# Patient Record
Sex: Female | Born: 1945 | Race: Black or African American | Hispanic: No | Smoking: Former smoker
Health system: Southern US, Community
[De-identification: ages and names within clinical notes are randomized; demographics above are authoritative.]

## PROBLEM LIST (undated history)

## (undated) DIAGNOSIS — M51369 Other intervertebral disc degeneration, lumbar region without mention of lumbar back pain or lower extremity pain: Secondary | ICD-10-CM

## (undated) DIAGNOSIS — R42 Dizziness and giddiness: Secondary | ICD-10-CM

## (undated) DIAGNOSIS — J329 Chronic sinusitis, unspecified: Secondary | ICD-10-CM

## (undated) DIAGNOSIS — K589 Irritable bowel syndrome without diarrhea: Secondary | ICD-10-CM

## (undated) DIAGNOSIS — M2141 Flat foot [pes planus] (acquired), right foot: Secondary | ICD-10-CM

## (undated) DIAGNOSIS — M47812 Spondylosis without myelopathy or radiculopathy, cervical region: Secondary | ICD-10-CM

## (undated) DIAGNOSIS — M5431 Sciatica, right side: Secondary | ICD-10-CM

## (undated) DIAGNOSIS — Z8719 Personal history of other diseases of the digestive system: Secondary | ICD-10-CM

## (undated) DIAGNOSIS — I10 Essential (primary) hypertension: Secondary | ICD-10-CM

## (undated) DIAGNOSIS — Z9889 Other specified postprocedural states: Secondary | ICD-10-CM

## (undated) DIAGNOSIS — M8580 Other specified disorders of bone density and structure, unspecified site: Secondary | ICD-10-CM

## (undated) DIAGNOSIS — M1990 Unspecified osteoarthritis, unspecified site: Secondary | ICD-10-CM

## (undated) DIAGNOSIS — R55 Syncope and collapse: Secondary | ICD-10-CM

## (undated) DIAGNOSIS — K5790 Diverticulosis of intestine, part unspecified, without perforation or abscess without bleeding: Secondary | ICD-10-CM

## (undated) DIAGNOSIS — M2142 Flat foot [pes planus] (acquired), left foot: Secondary | ICD-10-CM

## (undated) DIAGNOSIS — R112 Nausea with vomiting, unspecified: Secondary | ICD-10-CM

## (undated) DIAGNOSIS — K5900 Constipation, unspecified: Secondary | ICD-10-CM

## (undated) DIAGNOSIS — R931 Abnormal findings on diagnostic imaging of heart and coronary circulation: Secondary | ICD-10-CM

## (undated) DIAGNOSIS — M5134 Other intervertebral disc degeneration, thoracic region: Secondary | ICD-10-CM

## (undated) DIAGNOSIS — R51 Headache: Secondary | ICD-10-CM

## (undated) DIAGNOSIS — F419 Anxiety disorder, unspecified: Secondary | ICD-10-CM

## (undated) DIAGNOSIS — M5030 Other cervical disc degeneration, unspecified cervical region: Secondary | ICD-10-CM

## (undated) DIAGNOSIS — K635 Polyp of colon: Secondary | ICD-10-CM

## (undated) DIAGNOSIS — J309 Allergic rhinitis, unspecified: Secondary | ICD-10-CM

## (undated) DIAGNOSIS — K219 Gastro-esophageal reflux disease without esophagitis: Secondary | ICD-10-CM

## (undated) DIAGNOSIS — M5136 Other intervertebral disc degeneration, lumbar region: Secondary | ICD-10-CM

## (undated) DIAGNOSIS — G8929 Other chronic pain: Secondary | ICD-10-CM

## (undated) HISTORY — DX: Irritable bowel syndrome, unspecified: K58.9

## (undated) HISTORY — DX: Other intervertebral disc degeneration, thoracic region: M51.34

## (undated) HISTORY — DX: Polyp of colon: K63.5

## (undated) HISTORY — DX: Constipation, unspecified: K59.00

## (undated) HISTORY — PX: EYE SURGERY: SHX253

## (undated) HISTORY — DX: Other chronic pain: G89.29

## (undated) HISTORY — PX: INGUINAL HERNIA REPAIR: SUR1180

## (undated) HISTORY — PX: COLON SURGERY: SHX602

## (undated) HISTORY — DX: Dizziness and giddiness: R42

## (undated) HISTORY — DX: Abnormal findings on diagnostic imaging of heart and coronary circulation: R93.1

## (undated) HISTORY — DX: Other specified disorders of bone density and structure, unspecified site: M85.80

## (undated) HISTORY — PX: JOINT REPLACEMENT: SHX530

## (undated) HISTORY — PX: HERNIA REPAIR: SHX51

## (undated) HISTORY — DX: Sciatica, right side: M54.31

## (undated) HISTORY — DX: Flat foot (pes planus) (acquired), right foot: M21.41

## (undated) HISTORY — DX: Other intervertebral disc degeneration, lumbar region without mention of lumbar back pain or lower extremity pain: M51.369

## (undated) HISTORY — PX: LIPOMA EXCISION: SHX5283

## (undated) HISTORY — DX: Syncope and collapse: R55

## (undated) HISTORY — DX: Allergic rhinitis, unspecified: J30.9

## (undated) HISTORY — DX: Spondylosis without myelopathy or radiculopathy, cervical region: M47.812

## (undated) HISTORY — PX: ABDOMINAL HYSTERECTOMY: SHX81

## (undated) HISTORY — DX: Chronic sinusitis, unspecified: J32.9

## (undated) HISTORY — DX: Other cervical disc degeneration, unspecified cervical region: M50.30

## (undated) HISTORY — DX: Other intervertebral disc degeneration, lumbar region: M51.36

## (undated) HISTORY — DX: Flat foot (pes planus) (acquired), left foot: M21.42

## (undated) HISTORY — DX: Diverticulosis of intestine, part unspecified, without perforation or abscess without bleeding: K57.90

---

## 1998-07-14 ENCOUNTER — Other Ambulatory Visit: Admission: RE | Admit: 1998-07-14 | Discharge: 1998-07-14 | Payer: Self-pay | Admitting: Endocrinology

## 2000-03-28 ENCOUNTER — Encounter: Payer: Self-pay | Admitting: Internal Medicine

## 2000-03-28 ENCOUNTER — Ambulatory Visit (HOSPITAL_COMMUNITY): Admission: RE | Admit: 2000-03-28 | Discharge: 2000-03-28 | Payer: Self-pay | Admitting: Internal Medicine

## 2000-04-04 ENCOUNTER — Encounter: Admission: RE | Admit: 2000-04-04 | Discharge: 2000-07-03 | Payer: Self-pay | Admitting: Family Medicine

## 2000-09-14 ENCOUNTER — Encounter (INDEPENDENT_AMBULATORY_CARE_PROVIDER_SITE_OTHER): Payer: Self-pay | Admitting: *Deleted

## 2000-09-14 ENCOUNTER — Ambulatory Visit (HOSPITAL_COMMUNITY): Admission: RE | Admit: 2000-09-14 | Discharge: 2000-09-14 | Payer: Self-pay | Admitting: Gastroenterology

## 2000-09-19 ENCOUNTER — Ambulatory Visit (HOSPITAL_COMMUNITY): Admission: RE | Admit: 2000-09-19 | Discharge: 2000-09-19 | Payer: Self-pay | Admitting: Gastroenterology

## 2000-09-19 ENCOUNTER — Encounter: Payer: Self-pay | Admitting: Gastroenterology

## 2000-09-26 ENCOUNTER — Encounter (HOSPITAL_BASED_OUTPATIENT_CLINIC_OR_DEPARTMENT_OTHER): Payer: Self-pay | Admitting: General Surgery

## 2000-09-28 ENCOUNTER — Inpatient Hospital Stay (HOSPITAL_COMMUNITY): Admission: RE | Admit: 2000-09-28 | Discharge: 2000-10-03 | Payer: Self-pay | Admitting: General Surgery

## 2000-09-28 ENCOUNTER — Encounter (INDEPENDENT_AMBULATORY_CARE_PROVIDER_SITE_OTHER): Payer: Self-pay | Admitting: Specialist

## 2000-11-16 ENCOUNTER — Encounter: Payer: Self-pay | Admitting: Family Medicine

## 2000-11-16 ENCOUNTER — Encounter: Admission: RE | Admit: 2000-11-16 | Discharge: 2000-11-16 | Payer: Self-pay | Admitting: Family Medicine

## 2001-06-02 ENCOUNTER — Observation Stay (HOSPITAL_COMMUNITY): Admission: RE | Admit: 2001-06-02 | Discharge: 2001-06-03 | Payer: Self-pay | Admitting: General Surgery

## 2001-06-02 ENCOUNTER — Encounter (INDEPENDENT_AMBULATORY_CARE_PROVIDER_SITE_OTHER): Payer: Self-pay | Admitting: Specialist

## 2003-02-04 ENCOUNTER — Ambulatory Visit (HOSPITAL_COMMUNITY): Admission: RE | Admit: 2003-02-04 | Discharge: 2003-02-04 | Payer: Self-pay | Admitting: Gastroenterology

## 2004-03-20 ENCOUNTER — Ambulatory Visit (HOSPITAL_COMMUNITY): Admission: RE | Admit: 2004-03-20 | Discharge: 2004-03-20 | Payer: Self-pay | Admitting: Family Medicine

## 2004-03-20 ENCOUNTER — Encounter (INDEPENDENT_AMBULATORY_CARE_PROVIDER_SITE_OTHER): Payer: Self-pay | Admitting: Family Medicine

## 2004-03-20 ENCOUNTER — Encounter (INDEPENDENT_AMBULATORY_CARE_PROVIDER_SITE_OTHER): Payer: Self-pay | Admitting: Specialist

## 2004-07-08 ENCOUNTER — Encounter: Admission: RE | Admit: 2004-07-08 | Discharge: 2004-07-08 | Payer: Self-pay | Admitting: Neurology

## 2004-10-16 ENCOUNTER — Ambulatory Visit: Payer: Self-pay | Admitting: Hematology & Oncology

## 2004-12-11 ENCOUNTER — Ambulatory Visit: Payer: Self-pay | Admitting: Hematology & Oncology

## 2005-03-03 ENCOUNTER — Ambulatory Visit: Payer: Self-pay | Admitting: Hematology & Oncology

## 2005-05-13 ENCOUNTER — Encounter (INDEPENDENT_AMBULATORY_CARE_PROVIDER_SITE_OTHER): Payer: Self-pay | Admitting: *Deleted

## 2005-05-13 ENCOUNTER — Inpatient Hospital Stay (HOSPITAL_COMMUNITY): Admission: RE | Admit: 2005-05-13 | Discharge: 2005-05-17 | Payer: Self-pay | Admitting: General Surgery

## 2006-05-30 ENCOUNTER — Ambulatory Visit (HOSPITAL_COMMUNITY): Admission: RE | Admit: 2006-05-30 | Discharge: 2006-05-30 | Payer: Self-pay | Admitting: Orthopedic Surgery

## 2006-06-09 ENCOUNTER — Encounter: Admission: RE | Admit: 2006-06-09 | Discharge: 2006-06-09 | Payer: Self-pay | Admitting: Gastroenterology

## 2006-06-22 ENCOUNTER — Ambulatory Visit (HOSPITAL_COMMUNITY): Admission: RE | Admit: 2006-06-22 | Discharge: 2006-06-22 | Payer: Self-pay | Admitting: Gastroenterology

## 2006-06-22 ENCOUNTER — Encounter (INDEPENDENT_AMBULATORY_CARE_PROVIDER_SITE_OTHER): Payer: Self-pay | Admitting: Specialist

## 2007-02-16 ENCOUNTER — Encounter: Admission: RE | Admit: 2007-02-16 | Discharge: 2007-02-16 | Payer: Self-pay | Admitting: Orthopedic Surgery

## 2007-02-18 ENCOUNTER — Encounter: Admission: RE | Admit: 2007-02-18 | Discharge: 2007-02-18 | Payer: Self-pay | Admitting: General Surgery

## 2007-11-29 ENCOUNTER — Encounter: Admission: RE | Admit: 2007-11-29 | Discharge: 2007-11-29 | Payer: Self-pay | Admitting: Family Medicine

## 2007-12-04 ENCOUNTER — Encounter: Admission: RE | Admit: 2007-12-04 | Discharge: 2007-12-04 | Payer: Self-pay | Admitting: Orthopedic Surgery

## 2008-01-23 ENCOUNTER — Ambulatory Visit (HOSPITAL_COMMUNITY): Admission: RE | Admit: 2008-01-23 | Discharge: 2008-01-24 | Payer: Self-pay | Admitting: Ophthalmology

## 2008-08-08 ENCOUNTER — Inpatient Hospital Stay (HOSPITAL_COMMUNITY): Admission: RE | Admit: 2008-08-08 | Discharge: 2008-08-12 | Payer: Self-pay | Admitting: Orthopedic Surgery

## 2008-08-12 ENCOUNTER — Ambulatory Visit: Payer: Self-pay | Admitting: Vascular Surgery

## 2008-08-12 ENCOUNTER — Encounter (INDEPENDENT_AMBULATORY_CARE_PROVIDER_SITE_OTHER): Payer: Self-pay | Admitting: Orthopedic Surgery

## 2008-08-22 ENCOUNTER — Encounter: Admission: RE | Admit: 2008-08-22 | Discharge: 2008-08-22 | Payer: Self-pay | Admitting: Orthopedic Surgery

## 2010-07-23 ENCOUNTER — Encounter: Admission: RE | Admit: 2010-07-23 | Discharge: 2010-07-23 | Payer: Self-pay | Admitting: Gastroenterology

## 2010-12-06 ENCOUNTER — Encounter: Payer: Self-pay | Admitting: Hematology & Oncology

## 2011-03-30 NOTE — Op Note (Signed)
NAMENICOLLE, [name]                ACCOUNT NO.:  [id number]   MEDICAL RECORD NO.:  [id number]          PATIENT TYPE:  INP   LOCATION:  0006                         FACILITY:  [facility]   PHYSICIAN:  John L. Rendall, M.D.  DATE OF BIRTH:  [birth year]   DATE OF PROCEDURE:  08/08/2008  DATE OF DISCHARGE:                               OPERATIVE REPORT   PREOPERATIVE DIAGNOSIS:  Osteoarthritis right knee.   SURGICAL PROCEDURE:  Right LCS total knee replacement with computer  navigation assistance.   POSTOPERATIVE DIAGNOSIS:  Right LCS total knee replacement with computer  navigation assistance.   SURGEON:  John L. Rendall, M.D.   ASSISTANT:  Legrand Pitts. Duffy, PA-C.   ANESTHESIA:  General with a femoral nerve block.   PATHOLOGY:  Bone-on-bone medially was 6 degrees of varus and a flexion  contracture preop.  She has tried all conservative measures and is  living with constant pain.   DESCRIPTION OF PROCEDURE:  Under general anesthetic, the right leg was  prepared with DuraPrep and draped as a sterile field.  A high thigh  tourniquet was placed, the leg was wrapped out with the Esmarch and the  sterile tourniquet was used at 350 mm.  A midline incision was made.  The patella was everted.  The femur was sized to a standard.  Debridement was done in preparation for computer mapping.  Two Schanz  pins were placed in pinholes at the superomedial tibia and two Schanz  pins were placed in the distal medial femur.  The arrays were set up.  The femoral head and medial and lateral malleoli were identified, then  the proximal tibia and distal femur were mapped.  With this done, the  first guide was used for proximal tibial resection.  Once this was  completed, the knee was balanced and the varus deformity was corrected.  The flexion gap was then measured.  Anterior and posterior flare of the  distal femur were then resected with the computer.  The distal femoral  cut was made.  Gaps were balanced  at approximately 12.5 mm.  Recessing  guide was then used after debridement of meniscal remnants, cruciate  remnants and bone spurs off the back of the condyles.  Once this was  completed, the recessing guide was used and the proximal tibia was  exposed.  Minor spurs around the edges were debrided.  The tibia was  sized to a #3.  Center peg hole with keel was inserted.  A size 15  tibial tray was inserted with the standard femur and patellar component.  The knee was in just 2-3 degrees varus and it hyperextends about 3  degrees.  However, on attempting to go to the 17.5 bearing, the knee did  not flex fully and the joint felt overstuffed.  A 15 bearing was the  best fit.  The bony surfaces were then prepared with pulse irrigation.  All components were cemented in place.  The knee appeared to have  excellent longitudinal alignment and flexion to about 110 degrees  easily.  It was closed in layers with #1 [name], #  1 Vicryl, 2-0 Vicryl  and skin clips.  Operative time an hour and 30 minutes.  The patient  tolerated the procedure well and returned to recovery in good condition.      John L. Rendall, M.D.  Electronically Signed     JLR/MEDQ  D:  08/08/2008  T:  08/10/2008  Job:  [id number]

## 2011-03-30 NOTE — Op Note (Signed)
NAMEKIELE, HEAVRIN                ACCOUNT NO.:  [id number]   MEDICAL RECORD NO.:  [id number]          PATIENT TYPE:  AMB   LOCATION:  SDS                          FACILITY:  [facility]   PHYSICIAN:  John D. Ashley Royalty, M.D. 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   PROCEDURE:  Scleral buckle right eye, retinal photocoagulation right  eye.   SURGEON:  Alan Mulder,  M.D.   ASSISTANT:  Rosalie Doctor, MA   ANESTHESIA:  General.   DETAILS:  Usual prep and drape, 360 degrees limbal peritomy, isolation  of four rectus muscles on 2-0 silk.  Localization of break at 10:30  o'clock.  Scleral dissection for 360 degrees to admit a number 279  intrascleral implant.  Diathermy placed in the bed.  A 279 implant was  placed around the globe with 1 mm trimmed from the posterior edge.  A  240 band was placed around the globe with a 270 sleeve at 2 o'clock.  The joint was at 2 o'clock.  The buckle was adjusted and trimmed.  The  band was adjusted and trimmed.  508G radial segment was placed beneath  the break at 10 o'clock.  Perforation site chosen at 9 o'clock.  A large  amount of clear colorless subretinal fluid came forth in a slow  controlled manner.  Once the fluid stopped, the buckle elements were  secured and the scleral flaps were closed with interrupted 4-0  Mersilene.  Indirect ophthalmoscopy showed the break to be well-  supported and the retina to be in place.  The indirect ophthalmoscope  laser was moved into place, 630 burns were placed on the buckle around  the break and the power was 700 milliwatts, 1000 microns each and 0.1  seconds each.  The scleral sutures were knotted and the free ends  removed.  The conjunctiva was reposited with 7-0 chromic suture.  Polymyxin and gentamicin were irrigated into Tenon's space.   Atropine  solution was applied.  Marcaine was injected around the globe for postop  pain.  Decadron 10 mg was injected to the lower subconjunctival space.  TobraDex ophthalmic ointment, patch and shield were placed.  The patient  was awakened and taken to recovery in satisfactory condition.  Closing  pressure was 10 with a Barraquer tonometer.  Complications none.  Duration 1-1/2 hours.      Beulah Gandy. Ashley Royalty, M.D.  Electronically Signed     JDM/MEDQ  D:  01/23/2008  T:  01/24/2008  Job:  [id number]

## 2011-04-02 NOTE — Op Note (Signed)
[name], [name] NO.:  [id number]   MEDICAL RECORD NO.:  [id number]          PATIENT TYPE:  INP   LOCATION:  0001                         FACILITY:  [facility]   PHYSICIAN:  Leonie Man, M.D.   DATE OF BIRTH:  10/23/1946   DATE OF PROCEDURE:  05/13/2005  DATE OF DISCHARGE:                                 OPERATIVE REPORT   PREOPERATIVE DIAGNOSIS:  Recurrent ventral hernia   POSTOPERATIVE DIAGNOSIS:  Recurrent ventral hernia.   PROCEDURE:  Repair recurrent ventral hernia with mesh.   SURGEON:  Leonie Man, M.D.   ASSISTANT:  Anselm Pancoast. Zachery Dakins, M.D.   ANESTHESIA:  General.   NOTE:  The patient is a 65 year old female who is status post right  hemicolectomy following which she developed a ventral hernia which was  repaired with mesh in 2002. She has since developed two new hernias, both  inferior and superior to the previous repair. She comes to the operating  room now for repair of her abdominal wall after the risks and potential  benefits of surgery have been fully discussed, all questions answered, and  consent obtained.   DESCRIPTION OF PROCEDURE:  Following induction of satisfactory general  anesthesia with the patient positioned supinely, the abdomen was prepped and  draped to be included in the sterile operative field. A Foley catheter was  placed in the urinary bladder. The exploration was carried out through a  long midline incision through the old scar cicatrix down to the first hernia  sac which was located in the midline below the umbilicus extending to the  left and then up to the second hernia which is located in the epigastrium  extending to the right. Both hernia sacs were entered. The intervening mesh  between these hernias was then removed. Adhesions to the abdominal wall was  then taken down in their entirety, providing fresh edges the closure of the  abdominal wall. Skin flaps were raised both superiorly, inferiorly, and  laterally on both sides so as to allow for the mesh to be placed. I placed  six initial sutures at the 6 o'clock, 12 o'clock, 3 o'clock, 9 o'clock, 2  o'clock, 8 o'clock, 10 o'clock, and 4 o'clock positions. I then closed the  abdominal wall with a running suture of #1 Novofil. I then used a large  piece of ULTRAPRO mesh which extended across the entire abdominal wall  containing the hernia and keeping the hernia in place and over the initial  suture line in an onlay technique. The mesh was then secured with the  traction sutures as placed and then secured to the fascia with the  endoscopic Protacker. Following this, a running suture of #0 PDS was carried  around the edge of the mesh for the entire distance to secure it to the  underlying fascia. The repair appeared to be intact. Sponge and instrument  counts were all verified. The subcutaneous tissues were irrigated with  normal saline. I placed two 19-French Blake drains in the abdominal wall for  drainage. I then closed the subcutaneous tissues with running  2-0 Vicryl suture.  The skin was closed with staples and the drains attached  to the skin with 3-0 nylon sutures. Sterile dressings were applied. The  anesthetic was reversed, and the patient removed from the operating room to  the recovery room in stable condition. She tolerated the procedure well.       PB/MEDQ  D:  05/13/2005  T:  05/13/2005  Job:  [id number]   cc:   Renaye Rakers, M.D.  [phone] [address], [address]  [place]  [place] [address]  Fax: [phone]

## 2011-04-02 NOTE — Op Note (Signed)
NAME:  [name], [name]                          ACCOUNT NO.:  [id number]   MEDICAL RECORD NO.:  [id number]                   PATIENT TYPE:  AMB   LOCATION:  ENDO                                 FACILITY:  [facility]   PHYSICIAN:  Anselmo Rod, M.D.               DATE OF BIRTH:  [birth year]   DATE OF PROCEDURE:  02/04/2003  DATE OF DISCHARGE:                                 OPERATIVE REPORT   PROCEDURE:  Colonoscopy.   ENDOSCOPIST:  Anselmo Rod, M.D.   INSTRUMENT USED:  Pediatric adjustable Olympus colonoscope.   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   PRE-PROCEDURE PREPARATION:  An informed consent was procured from the  patient.  The patient was fasted for eight hours prior to the procedure, and  prepped with a bottle of MiraLax and __________ on the night prior to the  procedure.   PRE-PROCEDURE PHYSICAL EXAM:  VITAL SIGNS:  Stable.  NECK:  Supple.  CHEST:  Clear to auscultation.  HEART:  S1, S2 regular.  ABDOMEN:  Soft with a well-healed surgical scar.   DESCRIPTION OF PROCEDURE:  The patient was placed in the left lateral  decubitus position and sedated with 50 mg of Demerol and 5 mg of Versed  intravenously.  Once the patient was adequately sedated and maintained on  low-flow oxygen and continuous cardiac monitoring, the Olympus video  colonoscope was advanced in the rectum to the ileum without difficulty.  The  patient had a right hemicolectomy.  The anastomosis appeared healthy.  No  masses or polyps were seen.  A few scattered diverticula were seen in the  left colon.  Retroflexion revealed no acute abnormalities, except for small  non-bleeding internal hemorrhoids.  No masses or polyps were present.  The patient tolerated the  procedure well without complications.   IMPRESSION:  1. Status post right hemicolectomy.  2. Normal-appearing colon except for a few left-sided diverticula.  3. Small non-bleeding internal hemorrhoids, seen on retroflexion.   RECOMMENDATIONS:  1. Repeat colorectal cancer screening is recommended in at least five years,     unless the patient develops any     abnormal symptoms in the interim.  2. A high fiber diet with liberal fluid intake have been recommended.  3. Outpatient followup in case the patient develops any abnormal symptoms in     the interim.                                               Anselmo Rod, M.D.    JNM/MEDQ  D:  02/04/2003  T:  02/04/2003  Job:  [id number]   cc:   Tanya D. Daphine Deutscher, M.D.  [phone] [address]  [place]  [place] [address]  Fax: [phone]   Leonie Man, M.D.

## 2011-04-02 NOTE — Discharge Summary (Signed)
[place]  Patient:    [name], [name]                       MRN: [id number] Adm. Date:  [id number] Disc. Date: [id number] Attending:  Sonda Primes CC:         Anselmo Rod, M.D.  Geraldo Pitter, M.D.   Discharge Summary  ADMISSION DIAGNOSIS:  Villous adenoma of the ascending colon, rule out carcinoma.  POSTOPERATIVE DIAGNOSIS:  Villous adenoma of the ascending colon, rule out carcinoma (pathology is pending).  PROCEDURES: 1. Exploratory laparotomy. 2. Right-sided hemicolectomy with ileocolic anastomosis.  COMPLICATIONS:  None.  CONDITION ON DISCHARGE:  Improved.  HISTORY OF PRESENT ILLNESS:  The patient is a 65 year old woman who presented initially with a history of diverticulosis.  She was being evaluated by colonoscopy for heme-positive stools.  At the time of colonoscopic evaluation a villous adenoma of the ascending colon was seen in addition to the diverticulosis.  Biopsy showed a villous adenoma; however, because of the size of the tumor it could not be removed colonoscopically.  HOSPITAL COURSE:  She is now admitted and brought to the operating room where she underwent a right-sided hemicolectomy.  As noted, the pathology report has not yet returned.  Her postoperative report has been benign with the normal resumption of diet and with bowel activity.  Her wounds at time of discharge are healing satisfactorily.  DISCHARGE FOLLOWUP:  She is being discharged now to be followed in the office in two weeks.  DISCHARGE MEDICATIONS: 1. Vicodin one to two q.4h. p.r.n. pain. 2. She is to resume her usual antihypertensive medications.  DISCHARGE INSTRUCTIONS:  Activities:  As tolerated.  Diet:  Not restricted. DD:  10/03/00 TD:  10/03/00 Job: [id number]/[id number]

## 2011-04-02 NOTE — Procedures (Signed)
[place]  Patient:    CHAITRA, MAST                       MRN: [id number] Proc. Date: 09/14/00 Adm. Date:  [id number] Attending:  Charna Elizabeth CC:         Cephas Darby. Daphine Deutscher, M.D.   Procedure Report  DATE OF BIRTH:  November 22, 1945  PROCEDURE PERFORMED:  Colonoscopy with biopsies.  ENDOSCOPIST:  Anselmo Rod, M.D.  INSTRUMENT USED:  Olympus video colonoscope.  INDICATION FOR PROCEDURE:  Rectal bleeding with change in bowel habits and left lower quadrant pain in a 64 year old black female.  Rule out colonic polyps, masses, hemorrhoids, etc.  PREPROCEDURE PREPARATION: Informed consent was procured from the patient.  The patient was fasted for eight hours prior to the procedure and prepped with a bottle of magnesium citrate and a gallon of NuLytely the night prior to the procedure.  PREPROCEDURE PHYSICAL:  VITAL SIGNS:  The patient has stable vital signs.  NECK:  Supple.  CHEST:  Clear to auscultation.  S1 and S2 regular.  ABDOMEN:  Soft with normal abdominal bowel sounds.  DESCRIPTION OF PROCEDURE:  The patient was placed in the left lateral decubitus position and sedated with Demerol and Versed. Once the patient was adequately sedated and maintained on low flow oxygen, continuous cardiac monitoring, the Olympus video colonoscope was advanced from the rectum to the cecum with slight difficulty secondary to residual stool in the colon; however, visualization was adequate.  A lobulated sessile mass was seen in the right colon just proximal to the hepatic flexure which was biopsied for pathology.  This was somewhat "soft" to biopsy, may be a tubulovillous adenoma.  The rest of the colon up to the cecum appeared normal except for some scattered diverticular disease.  IMPRESSION: 1. Lobulated mass in the right colon just proximal to hepatic flexure.    Biopsied for pathology to rule out adenocarcinoma versus tubulovillous  adenoma. 2. Scattered diverticulosis colon. 3. Some residual stool in the colon.  RECOMMENDATIONS: 1. Await pathology results. 2. No nonsteroidals for now. 3. Check CEA level, CBC with differential, CMET, PT and PTT. 4. Surgical evaluation by Dr. Leonie Man within the next week. DD:  09/15/00 TD:  09/15/00 Job: [id number]

## 2011-04-02 NOTE — Discharge Summary (Signed)
NAMEZAELA, GRALEY                ACCOUNT NO.:  [id number]   MEDICAL RECORD NO.:  [id number]          PATIENT TYPE:  INP   LOCATION:  1607                         [facility]ACILITY:  [facility]   PHYSICIAN:  John L. Rendall, M.D.  DATE OF BIRTH:  1946/05/16   DATE OF ADMISSION:  08/08/2008  DATE OF DISCHARGE:  08/12/2008                               DISCHARGE SUMMARY   ADMISSION DIAGNOSES:  1. End-stage osteoarthritis bilateral knees, right worse than left.  2. Obesity.  3. Hypertension.  4. Mild anxiety.  5. History of lymphoma.   DISCHARGE DIAGNOSES:  1. End-stage osteoarthritis of bilateral knees now status post right      total knee arthroplasty.  2. Acute blood loss anemia secondary to surgery.  3. Postoperative nausea, now resolved.  4. Right calf pain, now resolved.  5. Obesity.  6. Hypertension.  7. Mild anxiety.  8. Lymphoma.   SURGICAL PROCEDURES:  On August 08, 2008. Ms. Farner underwent a right  total knee arthroplasty with computer navigation by Dr. Jonny Ruiz L. Rendall  assisted by Arnoldo Morale PA-C.  She had a DePuy NBT keel tibial tray  cemented size 3 placed with an LCS complete primary femoral component  standard size right.  An LCS complete metal back patella cemented size  standard with an LCS complete RP insert size standard 15 mm thickness.   COMPLICATIONS:  None.   CONSULTS:  1. Social work consult August 09, 2008 in addition to a physical      therapy consult.  2. Occupational therapy consult August 10, 2008.   HISTORY OF PRESENT ILLNESS:  This 65 year old black female patient  presented to Dr. Priscille Kluver with a 2-3 year history of gradual onset of  progressive right leg pain.  The right knee pain is a constant burn over  the anterior joint with radiation to the ankle.  It increases with  standing and walking, and decreases with lying and elevation.  She does  have occasional night pain, but she has failed conservative treatment.  X-rays show end-stage  arthritic changes of the knee. Because of this,  she is presenting for a right knee replacement.   HOSPITAL COURSE:  Ms. Suares tolerated her surgical procedure well and  was transferred to the orthopedic floor.  On postop day 1, T-max was  100, vitals were stable.  Hemoglobin 11.5, hematocrit 34.3, white count  12.8.  Leg was neurovascularly intact.  She did have some nausea.  Medicines were adjusted.  She was weaned off oxygen and started on  therapy per protocol.   Postop day 2, nausea had improved.  T-max 98.1, hemoglobin 9.5,  hematocrit 28.6, white count 13.9.  Dressing was changed.  Hemovac  discontinued.  She was switched to p.o. pain meds and did have some  hypokalemia at 3.1 which was supplemented orally.  She was continued on  therapy.   She continued to make good progress over the next several days.  Required several other supplements for her potassium.  On August 12, 2008, T-max was 99.7, vitals stable.  Hemoglobin 8.8.  She did have a  mildly positive Homans' sign.  Her potassium was still low at 3.4, so  she was supplemented again.  With her calf pain, a Doppler was obtained  which was negative for DVT.  She did make good enough progress with  therapy that day and with a negative Doppler, she was able to be  discharged home.   DISCHARGE DIET:  She can resume her regular prehospitalization diet.   MEDICATIONS:  She may resume her home meds as follows:  1. Benicar/HCT 40/25 mg 1 tablet p.o. q.a.m.  2. Alprazolam 0.5 mg p.o. q.a.m.  3. Aspirin, she is to hold at this time.  4. Over-the-counter antihistamine, she is to hold at this time.  5. Loratadine 10 mg p.o. q.a.m.  6. She is to hold her Tylenol Arthritis at this time.  7. Simethicone 125 mg p.o. p.r.n.   ADDITIONAL MEDICATIONS AT THIS TIME:  1. Arixtra 2.5 mg subcutaneous q.8 p.m. with the last dose on August 17, 2008.  2. On August 18, 2008, she is to resume her daily aspirin.  She is      given 5  __________ no refills.  3. Norco 5/325 1-2 tablets p.o. q.4 h. p.r.n. for pain, 60 with no      refill.  4. Robaxin 500 mg 1-2 tablets p.o. q.6 h. p.r.n. for spasms, 60 with      no refill.  5. She is also encouraged to take an iron supplement twice a day for 1      month.   ACTIVITY:  She can be out of bed weightbearing as tolerated on the right  leg with use of a walker.  No lifting or driving for 6 weeks.  Home  health PT per [facility] and home CPM 0-90 degrees 6-8 hours a day.  Please  see the blue total knee discharge sheet for further activity  instructions.   WOUND CARE:  She may shower after no drainage from the wound for 2 days.  Please see the blue total knee discharge sheet for further wound care  instructions.   FOLLOW UP:  She is to follow up with Dr. Priscille Kluver in our office on  Tuesday, August 20, 2008 and needs to call [phone] for that  appointment.   LABORATORY DATA:  Hemoglobin/hematocrit ranged from 12.4 and 37.7 on  August 05, 2008 to 8.8 and 26.8 on August 12, 2008.  White count  went from 7 on August 05 2008 to 13.9 on August 10, 2008 and 9.3  on August 12, 2008.  Platelets were within normal limits.  Potassium  went from 3.3 on August 05, 2008 to 3.7 on August 09, 2008 to 3.1  on August 10, 2008 to 3.4 on August 12, 2008.  CO2 went to a high  of 33 on  August 12, 2008.  Glucose ranged from 96 on August 05, 2008 to 141  on August 09, 2008 to 95 on August 12, 2008.  UA on August 05, 2008 showed small leukocyte esterase, rare epithelials, 0-2 red and  white cells and a few bacteria.  All other laboratory studies were  within normal limits.      Legrand Pitts Duffy, P.A.      John L. Rendall, M.D.  Electronically Signed    KED/MEDQ  D:  09/05/2008  T:  09/05/2008  Job:  [id number]

## 2011-04-02 NOTE — Discharge Summary (Signed)
[name], [name] NO.:  [id number]   MEDICAL RECORD NO.:  [id number]          PATIENT TYPE:  INP   LOCATION:  1426                         FACILITY:  [facility]   PHYSICIAN:  Leonie Man, M.D.   DATE OF BIRTH:  1946-04-17   DATE OF ADMISSION:  05/13/2005  DATE OF DISCHARGE:                                 DISCHARGE SUMMARY   ADMISSION DIAGNOSES:  1.  Recurrent ventral hernia.  2.  Hypertension.  3.  Status post right hemicolectomy for carcinoma.  4.  History of lymphoma.   DISCHARGE DIAGNOSES:  1.  Recurrent ventral hernia.  2.  Hypertension.  3.  Status post right hemicolectomy for carcinoma.  4.  History of lymphoma.   PROCEDURE:  Repair of recurrent ventral hernia.   COMPLICATIONS:  None.   CONDITION ON DISCHARGE:  Improved.   FOLLOW-UP:  One week at [facility] for removal of sutures.   TAKE-HOME PRESCRIPTIONS:  Percocet and Allegra.   DIET:  No restrictions.   ACTIVITY:  As tolerated.   The patient is a 65 year old female who is status post right hemicolectomy  in the past. She developed a ventral hernia following surgery and had that  repaired last in 2002. She has since developed two additional hernias at the  superior and inferior aspects of her incision. She was brought to the  operating room for repair of these hernias. This was performed on the day of  admission. Her postoperative course has been benign with normal resumption  of diet  and activity. At the time of discharge her wounds were healing well. She has  no calf tenderness or ankle edema. Her lungs are clear. She is being  discharged now to be followed up in the office in 1 week for suture removal.  Activity:  She is restricted to lifting no more than 15 pounds for the next  6 weeks. Diet is not restricted.       PB/MEDQ  D:  05/17/2005  T:  05/17/2005  Job:  [id number]   cc:   Renaye Rakers, M.D.  [phone] [address], [address]  [place]  [place] [address]  Fax: [phone]

## 2011-04-02 NOTE — Op Note (Signed)
[place]. [facility]  Patient:    [name], [name]                       MRN: [id number] Proc. Date: 06/02/01 Adm. Date:  [id number] Attending:  Sonda Primes                           Operative Report  PREOPERATIVE DIAGNOSIS:  Ventral hernia.  POSTOPERATIVE DIAGNOSIS:  Ventral hernia.  OPERATION PERFORMED:  Repair of ventral hernia with Prolene mesh.  SURGEON:  Mardene Celeste. Lurene Shadow, M.D.  ASSISTANT:  Nurse.  ANESTHESIA:  General.  INDICATIONS FOR PROCEDURE:  The patient is a 65 year old woman who is status post a right hemicolectomy some years ago for a large villous adenoma.  She returns now with abdominal pain and bulge.  On examination, she was noted to have a large ventral hernia just superior to the umbilicus and extending over towards the right abdomen.  She was brought to the operating room for repair after the risks and potential benefits of hernia repair under general anesthesia had been fully discussed with the patient and all her questions answered.  DESCRIPTION OF PROCEDURE:  Following the induction of satisfactory general anesthesia with the patient positioned supinely, the abdomen was routinely prepped and draped to be included in a sterile operative field.  I made a midline incision extending almost up to the xiphoid and then carried down to just above the umbilicus, deepened this through the skin and subcutaneous tissues carrying the dissection down to the hernia sac.  The sac was dissected free on all sides and opened and the contents contained within the sac were dissected free and allowed to reduce into the peritoneal cavity.  I then used a #1 Novofil to reimbricate the large epigastric portion of the abdomen which I did not find any hernias but was somewhat lax.  Then I implanted a Prolene mesh into the hernial defect after the sac had been dissected free and removed.  This was sewn in with a running #1 Novofil suture.   Sponge, instrument and sharp counts were verified.  All areas of dissection were then checked for hemostasis and noted to be dry.  Subcutaneous tissues were closed with a running 2-0 Vicryl suture and the skin closed with a running 4-0 Monocryl suture and then reinforced with Steri-Strips and a sterile dressing applied.  Anesthetic reversed.  Patient removed from the operating room to the recovery room in stable condition having tolerated the procedure well. DD:  06/02/01 TD:  06/02/01 Job: [id number]

## 2011-04-02 NOTE — Op Note (Signed)
[name], [name]                ACCOUNT NO.:  [id number]   MEDICAL RECORD NO.:  [id number]          PATIENT TYPE:  AMB   LOCATION:  ENDO                         FACILITY:  [facility]   PHYSICIAN:  Anselmo Rod, M.D.  DATE OF BIRTH:  February 15, 1946   DATE OF PROCEDURE:  06/22/2006  DATE OF DISCHARGE:                                 OPERATIVE REPORT   PROCEDURE PERFORMED:  Colonoscopy with cold biopsy x1.   ENDOSCOPIST:  Anselmo Rod, M.D.   INSTRUMENT USED:  Olympus video colonoscope.   INDICATIONS FOR PROCEDURE:  A 65 year old female undergoing screening  colonoscopy.  Patient has a right hemicolectomy for a tubulovillous adenoma,  recurrent polyps, masses, etc.   PREPROCEDURE PREPARATION:  Informed consent was procured from the patient.  The patient fasted for four hours prior to the procedure and prepped with 32  Osmoprep pills the night of and the morning of the procedure.  Risks and  benefits of the procedure including a 10% miss rate of cancer and polyps was  discussed with the patient as well.   PREPROCEDURE PHYSICAL:  VITAL SIGNS:  Stable vital signs.  NECK:  Supple.  CHEST:  Clear to auscultation.  CARDIOVASCULAR:  S1 and S2 regular.  ABDOMEN:  Soft with normal bowel sounds.   DESCRIPTION OF PROCEDURE:  The patient was placed in the left lateral  decubitus position, sedated with 75 mcg of Fentanyl and 5 mg of Versed in  slow incremental doses.  Once the patient was adequately sedated and  maintained on low flow oxygen and continuous cardiac monitoring, the Olympus  video colonoscope was advanced from the rectum to the anastomosis with  slight difficulty.  There was some residual stool in the colon.  Multiple  washings were done.  A small sessile polyp was biopsied from the  rectosigmoid colon (cold biopsies x1).  There was extensive sigmoid  diverticulosis noted.  A healthy anastomosis was noted at 90 cm, small  internal hemorrhoids were seen on retroflexion.  The  patient tolerated the  procedure well without complications.   IMPRESSION:  1.Small nonbleeding internal hemorrhoids.  2.Extensive sigmoid diverticulosis.  3.Small sessile polyp biopsied from rectosigmoid colon (cold biopsy x1).  4.Significant amount of residual stool in the colon, multiple washings,  small lesions could be missed.   RECOMMENDATIONS:  1.Continue a high fiber diet with liberal fluid intake.  2.Repeat colonoscopy in the next five years unless the patient develops any  abdominal symptoms in the interim, in which case she should contact the  office immediately.  3.Outpatient follow-up as need arises in the future.      Anselmo Rod, M.D.  Electronically Signed     JNM/MEDQ  D:  06/22/2006  T:  06/22/2006  Job:  [id number]   cc:   Renaye Rakers, M.D.  Leonie Man, M.D.

## 2011-04-02 NOTE — Op Note (Signed)
[place]. [facility]  Patient:    [name], [name]                       MRN: [id number] Proc. Date: 09/28/00 Adm. Date:  [id number] Attending:  Sonda Primes CC:         Anselmo Rod, M.D.  Tanya D. Daphine Deutscher, M.D., [facility]   Operative Report  PREOPERATIVE DIAGNOSIS:  Villous adenoma of ascending colon, rule out carcinoma.  POSTOPERATIVE DIAGNOSIS:   Villous adenoma of ascending colon, rule out carcinoma.  PROCEDURE:  Exploratory laparotomy, right hemicolectomy, with ileocolic anastomosis, takedown splenic flexure.  SURGEON:  Mardene Celeste. Lurene Shadow, M.D.  ASSISTANT:  Marnee Spring. Wiliam Ke, M.D.  ANESTHESIA:  General.  CLINICAL NOTE:  The patient is a 65 year old woman who on colonoscopy is noted to have a villous adenoma of the colon located in the ascending colon.  She is brought to the operating room for right hemicolectomy for removal of this broad-based lesion, which may have some carcinoma within it.  DESCRIPTION OF PROCEDURE:  Following the induction of anesthesia, the patient was positioned supinely and the abdomen is prepped and draped to be included in the sterile operative field.  The exploratory laparotomy is created through a midline incision, deepened through the skin and subcutaneous tissue and through the linea alba.  Upon entering the abdomen, there were a few adhesions that were noted to be taken down.  The right colon was mobilized at the cecum, and an exploration was carried out so as to locate the mass within the ascending colon.  This could not be felt by palpation. I then created a colotomy at the cecum and inserted a sterile proctoscope into the cecum in search of the lesion.  The lesion was not initially identified, so the entire right colon was mobilized all the way down to the splenic flexure, where better visualization of the colon could be carried out.  This was done, and through a second colotomy and with the  proctoscope inserted into the colon, the lesion was eventually noted.  It was indeed in the right colon just proximal to the hepatic flexure.  The colotomies were closed with both pursestring sutures and interrupted Lembert sutures of 3-0 silk.  The right colon was fully mobilized around the hepatic flexure as previously, and the colon was transected at just proximate to the middle colic vessels, and the distal ileum was transected.  This was done with GIA staples.  The entire mesentery of the right colon was then mobilized fully, protective of the duodenum.  The mesenteric _____ was taken between clamps and secured with ties of 2-0 silk.  The mesenteric defect was then closed with interrupted 3-0 silk sutures and a functional end-to-end anastomosis carried out with a GIA stapler and a TA60 stapling device.  The resulting anastomosis was noted to be widely patent.  Sponge, instrument, and sharp counts were then verified.  The wound was thoroughly irrigated with multiple aliquots of normal saline and then closed in layers as follows:  The midline closed with a running suture of #1 Novofil, subcutaneous tissues were irrigated, skin closed with staples. Sterile dressings applied, anesthetic reversed, patient moved from the operating room to the recovery room in stable condition, having tolerated the procedure well. DD:  09/28/00 TD:  09/28/00 Job: [id number]

## 2011-08-09 LAB — BASIC METABOLIC PANEL
BUN: 14
CO2: 28
Calcium: 9.6
Creatinine, Ser: 0.83
GFR calc Af Amer: >60
Glucose, Bld: 103 — ABNORMAL HIGH
Potassium: 3.2 — ABNORMAL LOW

## 2011-08-09 LAB — CBC: RBC: 4.46

## 2011-08-16 LAB — COMPREHENSIVE METABOLIC PANEL
Albumin: 3.6
Alkaline Phosphatase: 97
Creatinine, Ser: 0.84
GFR calc non Af Amer: >60
Total Bilirubin: 0.8

## 2011-08-16 LAB — PROTIME-INR
INR: 1.1
Prothrombin Time: 14.1

## 2011-08-16 LAB — BASIC METABOLIC PANEL
CO2: 30
CO2: 33 — ABNORMAL HIGH
Calcium: 8.5
Calcium: 8.7
Calcium: 9
Chloride: 103
Creatinine, Ser: 0.63
Creatinine, Ser: 0.69
GFR calc Af Amer: >60
GFR calc Af Amer: >60
GFR calc Af Amer: >60
GFR calc non Af Amer: >60
Glucose, Bld: 95
Potassium: 3.1 — ABNORMAL LOW
Sodium: 138
Sodium: 139

## 2011-08-16 LAB — CBC
HCT: 28.6 — ABNORMAL LOW
HCT: 37.7
Hemoglobin: 11.5 — ABNORMAL LOW
Hemoglobin: 9.5 — ABNORMAL LOW
MCHC: 32.8
MCHC: 32.9
MCHC: 33
MCHC: 33.3
MCHC: 33.6
MCV: 85.8
MCV: 86.1
Platelets: 325
RBC: 3.12 — ABNORMAL LOW
RBC: 3.27 — ABNORMAL LOW
RBC: 3.32 — ABNORMAL LOW
RBC: 4.01
RDW: 13
RDW: 13
RDW: 13.2
WBC: 12.8 — ABNORMAL HIGH

## 2011-08-16 LAB — URINE MICROSCOPIC-ADD ON

## 2011-08-16 LAB — DIFFERENTIAL
Basophils Relative: 0
Lymphocytes Relative: 39
Lymphs Abs: 2.7
Monocytes Relative: 7
Neutrophils Relative %: 50

## 2011-08-16 LAB — ABO/RH: ABO/RH(D): A POS

## 2011-08-16 LAB — URINE CULTURE
Colony Count: NO GROWTH
Culture: NO GROWTH

## 2011-08-16 LAB — CROSSMATCH
ABO/RH(D): A POS
Antibody Screen: NEGATIVE

## 2011-08-16 LAB — URINALYSIS, ROUTINE W REFLEX MICROSCOPIC
Bilirubin Urine: NEGATIVE
Hgb urine dipstick: NEGATIVE
Nitrite: NEGATIVE
Specific Gravity, Urine: 1.022
Urobilinogen, UA: 0.2
pH: 5.5

## 2014-02-14 ENCOUNTER — Other Ambulatory Visit: Payer: Self-pay | Admitting: Orthopedic Surgery

## 2014-03-06 ENCOUNTER — Encounter (HOSPITAL_COMMUNITY): Payer: Self-pay | Admitting: Pharmacy Technician

## 2014-03-12 ENCOUNTER — Encounter (HOSPITAL_COMMUNITY): Payer: Self-pay

## 2014-03-12 ENCOUNTER — Encounter (INDEPENDENT_AMBULATORY_CARE_PROVIDER_SITE_OTHER): Payer: Self-pay

## 2014-03-12 ENCOUNTER — Ambulatory Visit (HOSPITAL_COMMUNITY)
Admission: RE | Admit: 2014-03-12 | Discharge: 2014-03-12 | Disposition: A | Discharge status: Home / Self Care | Payer: Medicare Other | Source: Ambulatory Visit | Attending: Anesthesiology | Admitting: Anesthesiology

## 2014-03-12 ENCOUNTER — Encounter (HOSPITAL_COMMUNITY)
Admission: RE | Admit: 2014-03-12 | Discharge: 2014-03-12 | Disposition: A | Discharge status: Home / Self Care | Payer: Medicare Other | Source: Ambulatory Visit | Attending: Orthopedic Surgery | Admitting: Orthopedic Surgery

## 2014-03-12 DIAGNOSIS — Z01818 Encounter for other preprocedural examination: Secondary | ICD-10-CM | POA: Insufficient documentation

## 2014-03-12 DIAGNOSIS — I10 Essential (primary) hypertension: Secondary | ICD-10-CM | POA: Insufficient documentation

## 2014-03-12 DIAGNOSIS — Z01812 Encounter for preprocedural laboratory examination: Secondary | ICD-10-CM | POA: Insufficient documentation

## 2014-03-12 HISTORY — DX: Anxiety disorder, unspecified: F41.9

## 2014-03-12 HISTORY — DX: Essential (primary) hypertension: I10

## 2014-03-12 HISTORY — DX: Gastro-esophageal reflux disease without esophagitis: K21.9

## 2014-03-12 HISTORY — DX: Personal history of other diseases of the digestive system: Z87.19

## 2014-03-12 HISTORY — DX: Nausea with vomiting, unspecified: R11.2

## 2014-03-12 HISTORY — DX: Unspecified osteoarthritis, unspecified site: M19.90

## 2014-03-12 HISTORY — DX: Headache: R51

## 2014-03-12 HISTORY — DX: Other specified postprocedural states: Z98.890

## 2014-03-12 LAB — URINE MICROSCOPIC-ADD ON

## 2014-03-12 LAB — URINALYSIS, ROUTINE W REFLEX MICROSCOPIC
Bilirubin Urine: NEGATIVE
Glucose, UA: NEGATIVE mg/dL
Hgb urine dipstick: NEGATIVE
KETONES UR: NEGATIVE mg/dL
NITRITE: NEGATIVE
Protein, ur: NEGATIVE mg/dL
Specific Gravity, Urine: 1.019 (ref 1.005–1.030)
UROBILINOGEN UA: 1 mg/dL (ref 0.0–1.0)
pH: 6 (ref 5.0–8.0)

## 2014-03-12 LAB — CBC
HEMATOCRIT: 38.9 % (ref 36.0–46.0)
Hemoglobin: 12.6 g/dL (ref 12.0–15.0)
MCH: 27.5 pg (ref 26.0–34.0)
MCHC: 32.4 g/dL (ref 30.0–36.0)
MCV: 84.9 fL (ref 78.0–100.0)
PLATELETS: 263 10*3/uL (ref 150–400)
RBC: 4.58 MIL/uL (ref 3.87–5.11)
RDW: 13.4 % (ref 11.5–15.5)
WBC: 12.7 10*3/uL — AB (ref 4.0–10.5)

## 2014-03-12 LAB — SURGICAL PCR SCREEN
MRSA, PCR: NEGATIVE
Staphylococcus aureus: NEGATIVE

## 2014-03-12 LAB — COMPREHENSIVE METABOLIC PANEL
ALBUMIN: 4 g/dL (ref 3.5–5.2)
ALK PHOS: 114 U/L (ref 39–117)
ALT: 13 U/L (ref 0–35)
AST: 17 U/L (ref 0–37)
BUN: 14 mg/dL (ref 6–23)
CHLORIDE: 101 meq/L (ref 96–112)
CO2: 28 mEq/L (ref 19–32)
Calcium: 10.2 mg/dL (ref 8.4–10.5)
Creatinine, Ser: 0.79 mg/dL (ref 0.50–1.10)
GFR calc Af Amer: >90 mL/min (ref >90)
GFR calc non Af Amer: 82 mL/min — ABNORMAL LOW (ref >90)
Glucose, Bld: 103 mg/dL — ABNORMAL HIGH (ref 70–99)
Potassium: 4.3 mEq/L (ref 3.7–5.3)
Sodium: 141 mEq/L (ref 137–147)
TOTAL PROTEIN: 8.2 g/dL (ref 6.0–8.3)
Total Bilirubin: 0.5 mg/dL (ref 0.3–1.2)

## 2014-03-12 LAB — APTT: APTT: 26 s (ref 24–37)

## 2014-03-12 LAB — PROTIME-INR
INR: 1.06 (ref 0.00–1.49)
PROTHROMBIN TIME: 13.6 s (ref 11.6–15.2)

## 2014-03-12 NOTE — Progress Notes (Signed)
01/07/2014-EKG from Dr. Chestine Sporelark on chart.

## 2014-03-12 NOTE — Patient Instructions (Signed)
20     Your procedure is scheduled on:  Monday 03/18/2014  Report to [facility] at  0730 AM.  Call this number if you have problems the night before or morning of surgery: [phone]   Remember:             IF YOU USE CPAP,BRING MASK AND TUBING AM OF SURGERY!             IF YOU DO NOT HAVE YOUR TYPE AND SCREEN DRAWN AT PRE-ADMIT APPOINTMENT, YOU WILL HAVE IT DRAWN AM OF SURGERY!   Do not eat food or drink liquids AFTER MIDNIGHT!  Take these medicines the morning of surgery with A SIP OF WATER: Flonase nasal spray, Prilosec    [facility] IS NOT RESPONSIBLE FOR ANY BELONGINGS OR VALUABLES BROUGHT TO HOSPITAL.  Marland Kitchen  Leave suitcase in the car. After surgery it may be brought to your room.  For patients admitted to the hospital, checkout time is 11:00 AM the day of              Discharge.    DO NOT WEAR JEWELRY,MAKE-UP,LOTIONS,POWDERS,PERFUMES,CONTACTS , DENTURES OR BRIDGEWORK ,AND DO NOT WEAR FALSE EYELASHES                                    Patients discharged the day of surgery will not be allowed to drive home. If going home the same day of surgery, must have someone stay with you  first 24 hrs.at home and arrange for someone to drive you home from the Hospital.                         YOUR DRIVER IS:   Special Instructions:              Please read over the following fact sheets that you were given:             1. [place] PREPARING FOR SURGERY SHEET              2.[facility]                                        [place].[name]     [phone]                              [facility] - Preparing for Surgery Before surgery, you can play an important role.  Because skin is not sterile, your skin needs to be as free of germs as possible.  You can reduce the number of germs on your skin by washing with CHG (chlorahexidine gluconate) soap before surgery.  CHG is an antiseptic cleaner which kills germs and bonds with the skin to continue killing  germs even after washing. Please DO NOT use if you have an allergy to CHG or antibacterial soaps.  If your skin becomes reddened/irritated stop using the CHG and inform your nurse when you arrive at Short Stay. Do not shave (including legs and underarms) for at least 48 hours prior to the first CHG shower.  You may shave your face. Please follow these instructions carefully:  1.  Shower with CHG Soap the night before surgery and the  morning of  Surgery.  2.  If you choose to wash your hair, wash your hair first as usual with your  normal  shampoo.  3.  After you shampoo, rinse your hair and body thoroughly to remove the  shampoo.                           4.  Use CHG as you would any other liquid soap.  You can apply chg directly  to the skin and wash                       Gently with a scrungie or clean washcloth.  5.  Apply the CHG Soap to your body ONLY FROM THE NECK DOWN.   Do not use on open                           Wound or open sores. Avoid contact with eyes, ears mouth and genitals (private parts).                        Genitals (private parts) with your normal soap.             6.  Wash thoroughly, paying special attention to the area where your surgery  will be performed.  7.  Thoroughly rinse your body with warm water from the neck down.  8.  DO NOT shower/wash with your normal soap after using and rinsing off  the CHG Soap.                9.  Pat yourself dry with a clean towel.            10.  Wear clean pajamas.            11.  Place clean sheets on your bed the night of your first shower and do not  sleep with pets. Day of Surgery : Do not apply any lotions/deodorants the morning of surgery.  Please wear clean clothes to the hospital/surgery center.  FAILURE TO FOLLOW THESE INSTRUCTIONS MAY RESULT IN THE CANCELLATION OF YOUR SURGERY PATIENT SIGNATURE_________________________________  NURSE  SIGNATURE__________________________________  ________________________________________________________________________   [name]  An incentive spirometer is a tool that can help keep your lungs clear and active. This tool measures how well you are filling your lungs with each breath. Taking long deep breaths may help reverse or decrease the chance of developing breathing (pulmonary) problems (especially infection) following:  A long period of time when you are unable to move or be active. BEFORE THE PROCEDURE   If the spirometer includes an indicator to show your best effort, your nurse or respiratory therapist will set it to a desired goal.  If possible, sit up straight or lean slightly forward. Try not to slouch.  Hold the incentive spirometer in an upright position. INSTRUCTIONS FOR USE  1. Sit on the edge of your bed if possible, or sit up as far as you can in bed or on a chair. 2. Hold the incentive spirometer in an upright position. 3. Breathe out normally. 4. Place the mouthpiece in your mouth and seal your lips tightly around it. 5. Breathe in slowly and as deeply as possible, raising the piston or the ball toward the top of the column. 6. Hold your breath for 3-5 seconds or for as long as possible. Allow the piston or ball  to fall to the bottom of the column. 7. Remove the mouthpiece from your mouth and breathe out normally. 8. Rest for a few seconds and repeat Steps 1 through 7 at least 10 times every 1-2 hours when you are awake. Take your time and take a few normal breaths between deep breaths. 9. The spirometer may include an indicator to show your best effort. Use the indicator as a goal to work toward during each repetition. 10. After each set of 10 deep breaths, practice coughing to be sure your lungs are clear. If you have an incision (the cut made at the time of surgery), support your incision when coughing by placing a pillow or rolled up towels firmly  against it. Once you are able to get out of bed, walk around indoors and cough well. You may stop using the incentive spirometer when instructed by your caregiver.  RISKS AND COMPLICATIONS  Take your time so you do not get dizzy or light-headed.  If you are in pain, you may need to take or ask for pain medication before doing incentive spirometry. It is harder to take a deep breath if you are having pain. AFTER USE  Rest and breathe slowly and easily.  It can be helpful to keep track of a log of your progress. Your caregiver can provide you with a simple table to help with this. If you are using the spirometer at home, follow these instructions: SEEK MEDICAL CARE IF:   You are having difficultly using the spirometer.  You have trouble using the spirometer as often as instructed.  Your pain medication is not giving enough relief while using the spirometer.  You develop fever of 100.5 F (38.1 C) or higher. SEEK IMMEDIATE MEDICAL CARE IF:   You cough up bloody sputum that had not been present before.  You develop fever of 102 F (38.9 C) or greater.  You develop worsening pain at or near the incision site. MAKE SURE YOU:   Understand these instructions.  Will watch your condition.  Will get help right away if you are not doing well or get worse. Document Released: 03/14/2007 Document Revised: 01/24/2012 Document Reviewed: 05/15/2007 ExitCare Patient Information 2014 [employer], [place].   ________________________________________________________________________  WHAT IS A BLOOD TRANSFUSION? Blood Transfusion Information  A transfusion is the replacement of blood or some of its parts. Blood is made up of multiple cells which provide different functions.  Red blood cells carry oxygen and are used for blood loss replacement.  White blood cells fight against infection.  Platelets control bleeding.  Plasma helps clot blood.  Other blood products are available for  specialized needs, such as hemophilia or other clotting disorders. BEFORE THE TRANSFUSION  Who gives blood for transfusions?   Healthy volunteers who are fully evaluated to make sure their blood is safe. This is blood bank blood. Transfusion therapy is the safest it has ever been in the practice of medicine. Before blood is taken from a donor, a complete history is taken to make sure that person has no history of diseases nor engages in risky social behavior (examples are intravenous drug use or sexual activity with multiple partners). The donor's travel history is screened to minimize risk of transmitting infections, such as malaria. The donated blood is tested for signs of infectious diseases, such as HIV and hepatitis. The blood is then tested to be sure it is compatible with you in order to minimize the chance of a transfusion reaction. If you or a  relative donates blood, this is often done in anticipation of surgery and is not appropriate for emergency situations. It takes many days to process the donated blood. RISKS AND COMPLICATIONS Although transfusion therapy is very safe and saves many lives, the main dangers of transfusion include:   Getting an infectious disease.  Developing a transfusion reaction. This is an allergic reaction to something in the blood you were given. Every precaution is taken to prevent this. The decision to have a blood transfusion has been considered carefully by your caregiver before blood is given. Blood is not given unless the benefits outweigh the risks. AFTER THE TRANSFUSION  Right after receiving a blood transfusion, you will usually feel much better and more energetic. This is especially true if your red blood cells have gotten low (anemic). The transfusion raises the level of the red blood cells which carry oxygen, and this usually causes an energy increase.  The nurse administering the transfusion will monitor you carefully for complications. HOME CARE  INSTRUCTIONS  No special instructions are needed after a transfusion. You may find your energy is better. Speak with your caregiver about any limitations on activity for underlying diseases you may have. SEEK MEDICAL CARE IF:   Your condition is not improving after your transfusion.  You develop redness or irritation at the intravenous (IV) site. SEEK IMMEDIATE MEDICAL CARE IF:  Any of the following symptoms occur over the next 12 hours:  Shaking chills.  You have a temperature by mouth above 102 F (38.9 C), not controlled by medicine.  Chest, back, or muscle pain.  People around you feel you are not acting correctly or are confused.  Shortness of breath or difficulty breathing.  Dizziness and fainting.  You get a rash or develop hives.  You have a decrease in urine output.  Your urine turns a dark color or changes to pink, red, or brown. Any of the following symptoms occur over the next 10 days:  You have a temperature by mouth above 102 F (38.9 C), not controlled by medicine.  Shortness of breath.  Weakness after normal activity.  The white part of the eye turns yellow (jaundice).  You have a decrease in the amount of urine or are urinating less often.  Your urine turns a dark color or changes to pink, red, or brown. Document Released: 10/29/2000 Document Revised: 01/24/2012 Document Reviewed: 06/17/2008 [facility] Patient Information 2014 [place], [place].  _______________________________________________________________________

## 2014-03-17 ENCOUNTER — Other Ambulatory Visit: Payer: Self-pay | Admitting: Orthopedic Surgery

## 2014-03-17 NOTE — H&P (Signed)
[name] DOB: Sep 29, 1946 Divorced / Language: English / Race: Black or African American Female  Date of Admission:  03-18-2014  Chief Complaint:  Left Knee Pain  History of Present Illness The patient is scheduled for a left total knee arthroplasty to be performed by Dr. Gus RankinFrank V. Aluisio, MD at [facility] on 03-18-2014. The patient is a 68 year old female who presents with knee complaints. The patient is seen for a second opinion. The patient reports left knee symptoms including: pain which began year(s) ago without any known injury.The patient feels that the symptoms are worsening. The patient has the current diagnosis of knee osteoarthritis. Prior to being seen today the patient was previously evaluated by a colleague ([place]. Manson [place]Brown [place] Previous work-up for this problem has included knee x-rays. Past treatment for this problem has included intra-articular injection of corticosteroids and physical therapy. and include knee pain and difficulty ambulating (especially up or down steps). Current treatment includes nonsteroidal anti-inflammatory drugs (ibuprofen or aspirin). Risk factors include total knee replacement (Right TKA done in 2009. She cannot remember the name of her surgeon). [name] came in on referral from her PCP for continued left knee pain. She kas a previous right total knee replacement done back in 2009 and its doing pretty well. The left knee has gotten worse over thepast couple of months especially since October of 2014 when she had s severe flare up taking her to her PCP and then reff[place] to [place] in [place]. Her xrays done at that time showed bone on bone arthritis already. It was recommended she have a knee replacement done but she wanted a second opinion. She had been seen here in our practice for her neck in the past and wanted to come here. Referral from her PCP. She comes in with xrays from October. Her pain is  not severe but has been progressive. Most of her pain is in the proximal tibia and lateral over the outside ligament. The left knee is not quite as bad as the rihgt knee when she had it replaced but it has gotten worse. She describes "crunching" with the knee but no swelling, locking and fortunately not buckling. She does have some night pain and the knee is starting to have more constant pain at this time. She con go up her 15 steps at home fairly well but ging down her steps are more difficult. She does not have a lot of sedinatry stiffness but does have some pain getting up after sitting for a while. She is using OTC meds and has tried heat with some releif. No ice has been used. It not unbearable but progressive. She has a trip planned last fall but had to cancel it becuase of the knee flare up. She is now ready to proceed with the knee replacement on the left. They have been treated conservatively in the past for the above stated problem and despite conservative measures, they continue to have progressive pain and severe functional limitations and dysfunction. They have failed non-operative management including home exercise, medications, and injections. It is felt that they would benefit from undergoing total joint replacement. Risks and benefits of the procedure have been discussed with the patient and they elect to proceed with surgery. There are no active contraindications to surgery such as ongoing infection or rapidly progressive neurological disease.   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 Lymphocytic Gastroesophageal Reflux Disease Chronic Pain Migraine Headache High blood pressure Cataract Hiatal Hernia Hemorrhoids Chronic Gastritis Neck Pain   Family History Cerebrovascular Accident. sister Diabetes  Mellitus. sister Cancer. Sister. sister    Social History Illicit drug use. no Pain Contract. no Children. 2 Current work status. 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 divorced Tobacco / smoke exposure. no Tobacco use. Former smoker. former smoker; smoke(d) less than 1/2 pack(s) per day Alcohol use. current drinker; drinks wine and hard liquor; only occasionally per week Drug/Alcohol Rehab (Previously). no Exercise. Exercises rarely; does running / walking Drug/Alcohol Rehab (Currently). no Number of flights of stairs before winded. 1 Advance Directives. Healthcare POA Post-Surgical Plans. [facility]    Medication History Tribenzor (20-5-12.5MG  Tablet, Oral) Active. Omeprazole (40MG  Capsule DR, Oral) Active. Fluticasone Propionate (50MCG/ACT Suspension, Nasal) Active. Claritin (10MG  Tablet, Oral) Active. Aspirin Buffered (325MG  Tablet, Oral) Active. Ibuprofen (200MG  Tablet, Oral) Active. Medications Reconciled.   Past Surgical History Total Knee Replacement. Date: 07/2008. right Colectomy. Date: 2003. partial Cataract Surgery. Date: 02/2009. right Hysterectomy. Date: 65. complete (cancerous) Colon Polyp Removal - Colonoscopy Hiatal Hernia Repair. Date: 2004. Incisional Hernia Repair. Date: 2006. Repair of Right Detached Retina. Date: 01/2008.   Review of Systems General:Not Present- Chills, Fever, Night Sweats, Fatigue, Weight Gain, Weight Loss and Memory Loss. Skin:Not Present- Hives, Itching, Rash, Eczema and Lesions. HEENT:Present- Headache. Not Present- Tinnitus, Double Vision, Visual Loss, Hearing Loss and Dentures. Respiratory:Not Present- Shortness of breath with exertion, Shortness of breath at rest, Allergies, Coughing up blood and Chronic Cough. Cardiovascular:Not Present- Chest Pain, Racing/skipping heartbeats, Difficulty Breathing Lying Down, Murmur, Swelling and  Palpitations. Gastrointestinal:Present- Abdominal Pain and Constipation. Not Present- Bloody Stool, Heartburn, Vomiting, Nausea, Diarrhea, Difficulty Swallowing, Jaundice and Loss of appetitie. Female Genitourinary:Not Present- Blood in Urine, Urinary frequency, Weak urinary stream, Discharge, Flank Pain, Incontinence, Painful Urination, Urgency, Urinary Retention and Urinating at Night. Musculoskeletal:Present- Back Pain, Morning Stiffness and Spasms. Not Present- Muscle Weakness, Muscle Pain, Joint Swelling and Joint Pain. Neurological:Not Present- Tremor, Dizziness, Blackout spells, Paralysis, Difficulty with balance and Weakness. Psychiatric:Not Present- Insomnia.    Vitals Weight: 163 lb Height: 59 in Weight was reported by patient. Height was reported by patient. Body Surface Area: 1.75 m Body Mass Index: 32.92 kg/m Pulse: 72 (Regular) Resp.: 14 (Unlabored) BP: 142/78 (Sitting, Left Arm, Standard)     Physical Exam The physical exam findings are as follows:   General Mental Status - Alert, cooperative and good historian. General Appearance- pleasant. Not in acute distress. Orientation- Oriented X3. Build & Nutrition- Well nourished and Well developed.   Head and Neck Head- normocephalic, atraumatic . Neck Global Assessment- supple. no bruit auscultated on the right and no bruit auscultated on the left.   Eye Pupil- Bilateral- Regular and Round. Motion- Bilateral- EOMI.   Chest and Lung Exam Auscultation: Breath sounds:- clear at anterior chest wall and - clear at posterior chest wall. Adventitious sounds:- No Adventitious sounds.   Cardiovascular Auscultation:Rhythm- Regular rate and rhythm. Heart Sounds- S1 WNL and S2 WNL. Murmurs & Other Heart Sounds:Auscultation of the heart reveals - No Murmurs.   Abdomen Palpation/Percussion:Tenderness- Abdomen is non-tender to palpation. Rigidity (guarding)- Abdomen is  soft. Auscultation:Auscultation of the abdomen reveals - Bowel sounds normal.   Female Genitourinary Not done, not pertinent to present illness  Musculoskeletal Her right knee looks fine. Her range of motion is 0-105 degrees with no tenderness or instability.  Left knee with  no effusion. Range 5-105 degrees with marked crepitus on range of motion. There is medial joint line tenderness of the left knee. There is no lateral tenderness. No instability.  RADIOGRAPHS AP of both knees and lateral show the prosthesis on the right in good position with no abnormalities. On the left she has significant bone on bone arthritis of the medial and patellofemoral compartments with varus deformity and large osteophytes.   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 Lequita HaltAluisio.  Plan [facility]  PCP - Dr. Delorise JacksonValencia Eggleston-Clark - Patient has been seen preoperatively and felt to be stable for surgery. GI - Dr. Molli PoseyJuothi Mann  The patient will not receive TXA (tranexamic acid) due to: Cancer History  Please note that the patient states that she gets nauseated with anesthesia.   Signed electronically by Lauraine RinneAlexzandrew L Graelyn Bihl, III PA-C

## 2014-03-18 ENCOUNTER — Encounter (HOSPITAL_COMMUNITY): Payer: Medicare Other | Admitting: Anesthesiology

## 2014-03-18 ENCOUNTER — Encounter (HOSPITAL_COMMUNITY): Payer: Self-pay | Admitting: *Deleted

## 2014-03-18 ENCOUNTER — Inpatient Hospital Stay (HOSPITAL_COMMUNITY): Payer: Medicare Other | Admitting: Anesthesiology

## 2014-03-18 ENCOUNTER — Inpatient Hospital Stay (HOSPITAL_COMMUNITY)
Admission: RE | Admit: 2014-03-18 | Discharge: 2014-03-21 | DRG: 470 | Disposition: A | Discharge status: Skilled Nursing Facility | Payer: Medicare Other | Source: Ambulatory Visit | Attending: Orthopedic Surgery | Admitting: Orthopedic Surgery

## 2014-03-18 ENCOUNTER — Encounter (HOSPITAL_COMMUNITY)
Admission: RE | Disposition: A | Discharge status: Skilled Nursing Facility | Payer: Self-pay | Source: Ambulatory Visit | Attending: Orthopedic Surgery

## 2014-03-18 DIAGNOSIS — D62 Acute posthemorrhagic anemia: Secondary | ICD-10-CM | POA: Diagnosis not present

## 2014-03-18 DIAGNOSIS — Z87891 Personal history of nicotine dependence: Secondary | ICD-10-CM

## 2014-03-18 DIAGNOSIS — F411 Generalized anxiety disorder: Secondary | ICD-10-CM | POA: Diagnosis present

## 2014-03-18 DIAGNOSIS — Z833 Family history of diabetes mellitus: Secondary | ICD-10-CM

## 2014-03-18 DIAGNOSIS — M1710 Unilateral primary osteoarthritis, unspecified knee: Secondary | ICD-10-CM

## 2014-03-18 DIAGNOSIS — Z882 Allergy status to sulfonamides status: Secondary | ICD-10-CM

## 2014-03-18 DIAGNOSIS — M179 Osteoarthritis of knee, unspecified: Secondary | ICD-10-CM | POA: Diagnosis present

## 2014-03-18 DIAGNOSIS — Z79899 Other long term (current) drug therapy: Secondary | ICD-10-CM

## 2014-03-18 DIAGNOSIS — Z823 Family history of stroke: Secondary | ICD-10-CM

## 2014-03-18 DIAGNOSIS — Z860100 Personal history of colon polyps, unspecified: Secondary | ICD-10-CM

## 2014-03-18 DIAGNOSIS — E876 Hypokalemia: Secondary | ICD-10-CM | POA: Diagnosis not present

## 2014-03-18 DIAGNOSIS — Z888 Allergy status to other drugs, medicaments and biological substances status: Secondary | ICD-10-CM

## 2014-03-18 DIAGNOSIS — M898X9 Other specified disorders of bone, unspecified site: Secondary | ICD-10-CM | POA: Diagnosis present

## 2014-03-18 DIAGNOSIS — I10 Essential (primary) hypertension: Secondary | ICD-10-CM | POA: Diagnosis present

## 2014-03-18 DIAGNOSIS — Z7982 Long term (current) use of aspirin: Secondary | ICD-10-CM

## 2014-03-18 DIAGNOSIS — K219 Gastro-esophageal reflux disease without esophagitis: Secondary | ICD-10-CM | POA: Diagnosis present

## 2014-03-18 DIAGNOSIS — Z791 Long term (current) use of non-steroidal anti-inflammatories (NSAID): Secondary | ICD-10-CM

## 2014-03-18 HISTORY — PX: TOTAL KNEE ARTHROPLASTY: SHX125

## 2014-03-18 HISTORY — DX: Osteoarthritis of knee, unspecified: M17.9

## 2014-03-18 HISTORY — DX: Unilateral primary osteoarthritis, unspecified knee: M17.10

## 2014-03-18 LAB — TYPE AND SCREEN
ABO/RH(D): A POS
ANTIBODY SCREEN: NEGATIVE

## 2014-03-18 SURGERY — ARTHROPLASTY, KNEE, TOTAL
Anesthesia: General | Site: Knee | Laterality: Left

## 2014-03-18 MED ORDER — BUPIVACAINE LIPOSOME 1.3 % IJ SUSP
20.0000 mL | Freq: Once | INTRAMUSCULAR | Status: DC
  Filled 2014-03-18: qty 20

## 2014-03-18 MED ORDER — KETOROLAC TROMETHAMINE 15 MG/ML IJ SOLN: 7.5000 mg | Freq: Four times a day (QID) | INTRAMUSCULAR | Status: AC | PRN

## 2014-03-18 MED ORDER — METHOCARBAMOL 1000 MG/10ML IJ SOLN
500.0000 mg | Freq: Four times a day (QID) | INTRAMUSCULAR | Status: DC | PRN
  Administered 2014-03-18: 500 mg via INTRAVENOUS
  Filled 2014-03-18: qty 5

## 2014-03-18 MED ORDER — PANTOPRAZOLE SODIUM 40 MG PO TBEC
80.0000 mg | DELAYED_RELEASE_TABLET | Freq: Every day | ORAL | Status: DC
  Administered 2014-03-18: 80 mg via ORAL
  Filled 2014-03-18 (×2): qty 2

## 2014-03-18 MED ORDER — SODIUM CHLORIDE 0.9 % IJ SOLN
INTRAMUSCULAR | Status: DC | PRN
  Administered 2014-03-18: 30 mL

## 2014-03-18 MED ORDER — DEXAMETHASONE SODIUM PHOSPHATE 10 MG/ML IJ SOLN
10.0000 mg | Freq: Once | INTRAMUSCULAR | Status: AC
  Administered 2014-03-18: 10 mg via INTRAVENOUS

## 2014-03-18 MED ORDER — PROPOFOL 10 MG/ML IV BOLUS
INTRAVENOUS | Status: AC
  Filled 2014-03-18: qty 20

## 2014-03-18 MED ORDER — BUPIVACAINE LIPOSOME 1.3 % IJ SUSP
INTRAMUSCULAR | Status: DC | PRN
  Administered 2014-03-18: 20 mL

## 2014-03-18 MED ORDER — PROPOFOL 10 MG/ML IV BOLUS
INTRAVENOUS | Status: DC | PRN
  Administered 2014-03-18: 150 mg via INTRAVENOUS
  Administered 2014-03-18: 50 mg via INTRAVENOUS

## 2014-03-18 MED ORDER — LACTATED RINGERS IV SOLN
INTRAVENOUS | Status: DC
  Administered 2014-03-18: 11:00:00 via INTRAVENOUS
  Administered 2014-03-18: 1000 mL via INTRAVENOUS

## 2014-03-18 MED ORDER — BUPIVACAINE HCL 0.25 % IJ SOLN
INTRAMUSCULAR | Status: DC | PRN
  Administered 2014-03-18: 20 mL

## 2014-03-18 MED ORDER — CHLORHEXIDINE GLUCONATE 4 % EX LIQD: 60.0000 mL | Freq: Once | CUTANEOUS | Status: DC

## 2014-03-18 MED ORDER — DIPHENHYDRAMINE HCL 12.5 MG/5ML PO ELIX: 12.5000 mg | ORAL_SOLUTION | ORAL | Status: DC | PRN

## 2014-03-18 MED ORDER — MORPHINE SULFATE 2 MG/ML IJ SOLN
1.0000 mg | INTRAMUSCULAR | Status: DC | PRN
  Administered 2014-03-18: 2 mg via INTRAVENOUS
  Filled 2014-03-18: qty 1

## 2014-03-18 MED ORDER — RIVAROXABAN 10 MG PO TABS
10.0000 mg | ORAL_TABLET | Freq: Every day | ORAL | Status: DC
  Administered 2014-03-19 – 2014-03-21 (×3): 10 mg via ORAL
  Filled 2014-03-18 (×4): qty 1

## 2014-03-18 MED ORDER — LABETALOL HCL 5 MG/ML IV SOLN
INTRAVENOUS | Status: DC | PRN
  Administered 2014-03-18: 5 mg via INTRAVENOUS

## 2014-03-18 MED ORDER — METOCLOPRAMIDE HCL 5 MG/ML IJ SOLN: 5.0000 mg | Freq: Three times a day (TID) | INTRAMUSCULAR | Status: DC | PRN

## 2014-03-18 MED ORDER — EPHEDRINE SULFATE 50 MG/ML IJ SOLN
INTRAMUSCULAR | Status: AC
  Filled 2014-03-18: qty 1

## 2014-03-18 MED ORDER — BUPIVACAINE HCL (PF) 0.25 % IJ SOLN
INTRAMUSCULAR | Status: AC
  Filled 2014-03-18: qty 30

## 2014-03-18 MED ORDER — CEFAZOLIN SODIUM-DEXTROSE 2-3 GM-% IV SOLR
2.0000 g | Freq: Four times a day (QID) | INTRAVENOUS | Status: AC
  Administered 2014-03-18 (×2): 2 g via INTRAVENOUS
  Filled 2014-03-18 (×2): qty 50

## 2014-03-18 MED ORDER — PROMETHAZINE HCL 25 MG PO TABS: 25.0000 mg | ORAL_TABLET | Freq: Four times a day (QID) | ORAL | Status: DC | PRN

## 2014-03-18 MED ORDER — ALPRAZOLAM 0.5 MG PO TABS
0.5000 mg | ORAL_TABLET | Freq: Two times a day (BID) | ORAL | Status: DC | PRN
  Administered 2014-03-20: 0.5 mg via ORAL
  Filled 2014-03-18 (×2): qty 1

## 2014-03-18 MED ORDER — DEXAMETHASONE SODIUM PHOSPHATE 10 MG/ML IJ SOLN
10.0000 mg | Freq: Every day | INTRAMUSCULAR | Status: AC
  Filled 2014-03-18: qty 1

## 2014-03-18 MED ORDER — GLYCOPYRROLATE 0.2 MG/ML IJ SOLN
INTRAMUSCULAR | Status: DC | PRN
  Administered 2014-03-18: .4 mg via INTRAVENOUS

## 2014-03-18 MED ORDER — SODIUM CHLORIDE 0.9 % IJ SOLN
INTRAMUSCULAR | Status: AC
  Filled 2014-03-18: qty 10

## 2014-03-18 MED ORDER — ONDANSETRON HCL 4 MG/2ML IJ SOLN
INTRAMUSCULAR | Status: AC
  Filled 2014-03-18: qty 2

## 2014-03-18 MED ORDER — DOCUSATE SODIUM 100 MG PO CAPS
100.0000 mg | ORAL_CAPSULE | Freq: Two times a day (BID) | ORAL | Status: DC
  Administered 2014-03-18 – 2014-03-21 (×6): 100 mg via ORAL

## 2014-03-18 MED ORDER — FLUTICASONE PROPIONATE 50 MCG/ACT NA SUSP
1.0000 | Freq: Two times a day (BID) | NASAL | Status: DC
  Administered 2014-03-19 – 2014-03-21 (×3): 1 via NASAL
  Filled 2014-03-18 (×2): qty 16

## 2014-03-18 MED ORDER — SODIUM CHLORIDE 0.9 % IV SOLN: INTRAVENOUS | Status: DC

## 2014-03-18 MED ORDER — SUCCINYLCHOLINE CHLORIDE 20 MG/ML IJ SOLN
INTRAMUSCULAR | Status: DC | PRN
  Administered 2014-03-18: 100 mg via INTRAVENOUS

## 2014-03-18 MED ORDER — ACETAMINOPHEN 10 MG/ML IV SOLN
1000.0000 mg | Freq: Once | INTRAVENOUS | Status: AC
  Administered 2014-03-18: 1000 mg via INTRAVENOUS
  Filled 2014-03-18: qty 100

## 2014-03-18 MED ORDER — EPHEDRINE SULFATE 50 MG/ML IJ SOLN
INTRAMUSCULAR | Status: DC | PRN
  Administered 2014-03-18: 5 mg via INTRAVENOUS

## 2014-03-18 MED ORDER — NEOSTIGMINE METHYLSULFATE 10 MG/10ML IV SOLN
INTRAVENOUS | Status: AC
  Filled 2014-03-18: qty 1

## 2014-03-18 MED ORDER — LORATADINE 10 MG PO TABS
10.0000 mg | ORAL_TABLET | Freq: Every day | ORAL | Status: DC
  Administered 2014-03-18 – 2014-03-21 (×4): 10 mg via ORAL
  Filled 2014-03-18 (×4): qty 1

## 2014-03-18 MED ORDER — OLMESARTAN-AMLODIPINE-HCTZ 20-5-12.5 MG PO TABS: 1.0000 | ORAL_TABLET | Freq: Every morning | ORAL | Status: DC

## 2014-03-18 MED ORDER — ONDANSETRON HCL 4 MG PO TABS: 4.0000 mg | ORAL_TABLET | Freq: Four times a day (QID) | ORAL | Status: DC | PRN

## 2014-03-18 MED ORDER — METOCLOPRAMIDE HCL 10 MG PO TABS: 5.0000 mg | ORAL_TABLET | Freq: Three times a day (TID) | ORAL | Status: DC | PRN

## 2014-03-18 MED ORDER — ROCURONIUM BROMIDE 100 MG/10ML IV SOLN
INTRAVENOUS | Status: AC
  Filled 2014-03-18: qty 1

## 2014-03-18 MED ORDER — HYDROMORPHONE HCL PF 1 MG/ML IJ SOLN
INTRAMUSCULAR | Status: AC
Start: 2014-03-18 — End: 2014-03-19
  Filled 2014-03-18: qty 1

## 2014-03-18 MED ORDER — CEFAZOLIN SODIUM-DEXTROSE 2-3 GM-% IV SOLR
2.0000 g | INTRAVENOUS | Status: AC
  Administered 2014-03-18: 2 g via INTRAVENOUS

## 2014-03-18 MED ORDER — TRAMADOL HCL 50 MG PO TABS
50.0000 mg | ORAL_TABLET | Freq: Four times a day (QID) | ORAL | Status: DC | PRN
Start: 2014-03-18 — End: 2014-03-21
  Administered 2014-03-19 – 2014-03-20 (×2): 100 mg via ORAL
  Filled 2014-03-18 (×2): qty 2

## 2014-03-18 MED ORDER — ONDANSETRON HCL 4 MG/2ML IJ SOLN
INTRAMUSCULAR | Status: DC | PRN
  Administered 2014-03-18: 4 mg via INTRAVENOUS

## 2014-03-18 MED ORDER — HYDROMORPHONE HCL PF 1 MG/ML IJ SOLN
0.2500 mg | INTRAMUSCULAR | Status: DC | PRN
  Administered 2014-03-18 (×4): 0.5 mg via INTRAVENOUS

## 2014-03-18 MED ORDER — NEOSTIGMINE METHYLSULFATE 10 MG/10ML IV SOLN
INTRAVENOUS | Status: DC | PRN
  Administered 2014-03-18: 3 mg via INTRAVENOUS

## 2014-03-18 MED ORDER — CEFAZOLIN SODIUM-DEXTROSE 2-3 GM-% IV SOLR
INTRAVENOUS | Status: AC
  Filled 2014-03-18: qty 50

## 2014-03-18 MED ORDER — AMLODIPINE BESYLATE 5 MG PO TABS
5.0000 mg | ORAL_TABLET | Freq: Every day | ORAL | Status: DC
  Administered 2014-03-18: 5 mg via ORAL
  Filled 2014-03-18 (×2): qty 1

## 2014-03-18 MED ORDER — DEXAMETHASONE SODIUM PHOSPHATE 10 MG/ML IJ SOLN
INTRAMUSCULAR | Status: AC
  Filled 2014-03-18: qty 1

## 2014-03-18 MED ORDER — OXYCODONE HCL 5 MG PO TABS
5.0000 mg | ORAL_TABLET | ORAL | Status: DC | PRN
  Administered 2014-03-18 – 2014-03-21 (×15): 10 mg via ORAL
  Filled 2014-03-18 (×14): qty 2

## 2014-03-18 MED ORDER — MENTHOL 3 MG MT LOZG
1.0000 | LOZENGE | OROMUCOSAL | Status: DC | PRN
  Filled 2014-03-18: qty 9

## 2014-03-18 MED ORDER — FENTANYL CITRATE 0.05 MG/ML IJ SOLN
INTRAMUSCULAR | Status: AC
  Filled 2014-03-18: qty 5

## 2014-03-18 MED ORDER — IRBESARTAN 150 MG PO TABS
150.0000 mg | ORAL_TABLET | Freq: Every day | ORAL | Status: DC
  Administered 2014-03-18: 150 mg via ORAL
  Filled 2014-03-18 (×2): qty 1

## 2014-03-18 MED ORDER — ACETAMINOPHEN 500 MG PO TABS
1000.0000 mg | ORAL_TABLET | Freq: Four times a day (QID) | ORAL | Status: AC
  Administered 2014-03-18 – 2014-03-19 (×4): 1000 mg via ORAL
  Filled 2014-03-18 (×4): qty 2

## 2014-03-18 MED ORDER — LABETALOL HCL 5 MG/ML IV SOLN
INTRAVENOUS | Status: AC
  Filled 2014-03-18: qty 4

## 2014-03-18 MED ORDER — ACETAMINOPHEN 650 MG RE SUPP: 650.0000 mg | Freq: Four times a day (QID) | RECTAL | Status: DC | PRN

## 2014-03-18 MED ORDER — METHOCARBAMOL 500 MG PO TABS
500.0000 mg | ORAL_TABLET | Freq: Four times a day (QID) | ORAL | Status: DC | PRN
  Administered 2014-03-18 – 2014-03-21 (×6): 500 mg via ORAL
  Filled 2014-03-18 (×5): qty 1

## 2014-03-18 MED ORDER — FLEET ENEMA 7-19 GM/118ML RE ENEM: 1.0000 | ENEMA | Freq: Once | RECTAL | Status: AC | PRN

## 2014-03-18 MED ORDER — SODIUM CHLORIDE 0.9 % IR SOLN
Status: DC | PRN
  Administered 2014-03-18: 1000 mL

## 2014-03-18 MED ORDER — ACETAMINOPHEN 325 MG PO TABS: 650.0000 mg | ORAL_TABLET | Freq: Four times a day (QID) | ORAL | Status: DC | PRN

## 2014-03-18 MED ORDER — BISACODYL 10 MG RE SUPP: 10.0000 mg | Freq: Every day | RECTAL | Status: DC | PRN

## 2014-03-18 MED ORDER — LACTATED RINGERS IV SOLN: INTRAVENOUS | Status: DC

## 2014-03-18 MED ORDER — ONDANSETRON HCL 4 MG/2ML IJ SOLN
4.0000 mg | Freq: Four times a day (QID) | INTRAMUSCULAR | Status: DC | PRN
  Administered 2014-03-19: 4 mg via INTRAVENOUS
  Filled 2014-03-18: qty 2

## 2014-03-18 MED ORDER — ROCURONIUM BROMIDE 100 MG/10ML IV SOLN
INTRAVENOUS | Status: DC | PRN
  Administered 2014-03-18: 20 mg via INTRAVENOUS

## 2014-03-18 MED ORDER — SODIUM CHLORIDE 0.9 % IV SOLN
INTRAVENOUS | Status: DC
  Administered 2014-03-18 – 2014-03-19 (×2): via INTRAVENOUS

## 2014-03-18 MED ORDER — SODIUM CHLORIDE 0.9 % IJ SOLN
INTRAMUSCULAR | Status: AC
Start: 2014-03-18 — End: 2014-03-18
  Filled 2014-03-18: qty 50

## 2014-03-18 MED ORDER — AMLODIPINE BESYLATE 5 MG PO TABS
5.0000 mg | ORAL_TABLET | Freq: Once | ORAL | Status: AC
  Administered 2014-03-18: 5 mg via ORAL
  Filled 2014-03-18: qty 1

## 2014-03-18 MED ORDER — 0.9 % SODIUM CHLORIDE (POUR BTL) OPTIME
TOPICAL | Status: DC | PRN
  Administered 2014-03-18: 1000 mL

## 2014-03-18 MED ORDER — LIP MEDEX EX OINT
TOPICAL_OINTMENT | CUTANEOUS | Status: AC
  Filled 2014-03-18: qty 7

## 2014-03-18 MED ORDER — HYDROMORPHONE HCL PF 1 MG/ML IJ SOLN
INTRAMUSCULAR | Status: AC
  Filled 2014-03-18: qty 1

## 2014-03-18 MED ORDER — FENTANYL CITRATE 0.05 MG/ML IJ SOLN
INTRAMUSCULAR | Status: DC | PRN
  Administered 2014-03-18: 100 ug via INTRAVENOUS
  Administered 2014-03-18 (×5): 50 ug via INTRAVENOUS

## 2014-03-18 MED ORDER — DEXAMETHASONE 6 MG PO TABS
10.0000 mg | ORAL_TABLET | Freq: Every day | ORAL | Status: AC
  Administered 2014-03-19: 10 mg via ORAL
  Filled 2014-03-18: qty 1

## 2014-03-18 MED ORDER — GLYCOPYRROLATE 0.2 MG/ML IJ SOLN
INTRAMUSCULAR | Status: AC
  Filled 2014-03-18: qty 3

## 2014-03-18 MED ORDER — POLYETHYLENE GLYCOL 3350 17 G PO PACK: 17.0000 g | PACK | Freq: Every day | ORAL | Status: DC | PRN

## 2014-03-18 MED ORDER — FENTANYL CITRATE 0.05 MG/ML IJ SOLN
INTRAMUSCULAR | Status: AC
  Filled 2014-03-18: qty 2

## 2014-03-18 MED ORDER — HYDROCHLOROTHIAZIDE 12.5 MG PO CAPS
12.5000 mg | ORAL_CAPSULE | Freq: Every day | ORAL | Status: DC
  Administered 2014-03-18: 12.5 mg via ORAL
  Filled 2014-03-18 (×2): qty 1

## 2014-03-18 MED ORDER — PHENOL 1.4 % MT LIQD: 1.0000 | OROMUCOSAL | Status: DC | PRN

## 2014-03-18 SURGICAL SUPPLY — 56 items
BAG ZIPLOCK 12X15 (MISCELLANEOUS) ×3 IMPLANT
BANDAGE ELASTIC 6 VELCRO ST LF (GAUZE/BANDAGES/DRESSINGS) ×3 IMPLANT
BANDAGE ESMARK 6X9 LF (GAUZE/BANDAGES/DRESSINGS) ×1 IMPLANT
BLADE SAG 18X100X1.27 (BLADE) ×3 IMPLANT
BLADE SAW SGTL 11.0X1.19X90.0M (BLADE) ×3 IMPLANT
BNDG ESMARK 6X9 LF (GAUZE/BANDAGES/DRESSINGS) ×3
BOWL SMART MIX CTS (DISPOSABLE) ×3 IMPLANT
CAPT RP KNEE ×3 IMPLANT
CEMENT HV SMART SET (Cement) ×6 IMPLANT
CLOSURE WOUND 1/2 X4 (GAUZE/BANDAGES/DRESSINGS) ×2
CUFF TOURN SGL QUICK 34 (TOURNIQUET CUFF) ×2
CUFF TRNQT CYL 34X4X40X1 (TOURNIQUET CUFF) ×1 IMPLANT
DECANTER SPIKE VIAL GLASS SM (MISCELLANEOUS) ×3 IMPLANT
DRAPE EXTREMITY T 121X128X90 (DRAPE) ×3 IMPLANT
DRAPE POUCH INSTRU U-SHP 10X18 (DRAPES) ×3 IMPLANT
DRAPE U-SHAPE 47X51 STRL (DRAPES) ×3 IMPLANT
DRSG ADAPTIC 3X8 NADH LF (GAUZE/BANDAGES/DRESSINGS) ×3 IMPLANT
DRSG PAD ABDOMINAL 8X10 ST (GAUZE/BANDAGES/DRESSINGS) ×3 IMPLANT
DURAPREP 26ML APPLICATOR (WOUND CARE) ×3 IMPLANT
ELECT REM PT RETURN 9FT ADLT (ELECTROSURGICAL) ×3
ELECTRODE REM PT RTRN 9FT ADLT (ELECTROSURGICAL) ×1 IMPLANT
EVACUATOR 1/8 PVC DRAIN (DRAIN) ×3 IMPLANT
FACESHIELD WRAPAROUND (MASK) ×12 IMPLANT
GLOVE BIO SURGEON STRL SZ7.5 (GLOVE) ×3 IMPLANT
GLOVE BIO SURGEON STRL SZ8 (GLOVE) ×3 IMPLANT
GLOVE BIOGEL PI IND STRL 8 (GLOVE) ×3 IMPLANT
GLOVE BIOGEL PI INDICATOR 8 (GLOVE) ×6
GLOVE ECLIPSE 7.5 STRL STRAW (GLOVE) ×3 IMPLANT
GOWN SPEC L3 XXLG W/TWL (GOWN DISPOSABLE) ×3 IMPLANT
GOWN STRL REUS W/TWL LRG LVL3 (GOWN DISPOSABLE) ×3 IMPLANT
GOWN STRL REUS W/TWL XL LVL3 (GOWN DISPOSABLE) ×6 IMPLANT
HANDPIECE INTERPULSE COAX TIP (DISPOSABLE) ×3
IMMOBILIZER KNEE 20 (SOFTGOODS) ×3 IMPLANT
KIT BASIN OR (CUSTOM PROCEDURE TRAY) ×3 IMPLANT
MANIFOLD NEPTUNE II (INSTRUMENTS) ×3 IMPLANT
NDL SAFETY ECLIPSE 18X1.5 (NEEDLE) ×2 IMPLANT
NEEDLE HYPO 18GX1.5 SHARP (NEEDLE) ×6
NS IRRIG 1000ML POUR BTL (IV SOLUTION) ×3 IMPLANT
PACK TOTAL JOINT (CUSTOM PROCEDURE TRAY) ×3 IMPLANT
PADDING CAST COTTON 6X4 STRL (CAST SUPPLIES) ×9 IMPLANT
POSITIONER SURGICAL ARM (MISCELLANEOUS) ×3 IMPLANT
SET HNDPC FAN SPRY TIP SCT (DISPOSABLE) ×1 IMPLANT
SPONGE GAUZE 4X4 12PLY (GAUZE/BANDAGES/DRESSINGS) ×3 IMPLANT
STRIP CLOSURE SKIN 1/2X4 (GAUZE/BANDAGES/DRESSINGS) ×4 IMPLANT
SUCTION FRAZIER 12FR DISP (SUCTIONS) ×3 IMPLANT
SUT MNCRL AB 4-0 PS2 18 (SUTURE) ×3 IMPLANT
SUT VIC AB 2-0 CT1 27 (SUTURE) ×9
SUT VIC AB 2-0 CT1 TAPERPNT 27 (SUTURE) ×3 IMPLANT
SUT VLOC 180 0 24IN GS25 (SUTURE) ×3 IMPLANT
SYR 20CC LL (SYRINGE) ×3 IMPLANT
SYR 50ML LL SCALE MARK (SYRINGE) ×3 IMPLANT
TOWEL OR 17X26 10 PK STRL BLUE (TOWEL DISPOSABLE) ×3 IMPLANT
TOWEL OR NON WOVEN STRL DISP B (DISPOSABLE) ×3 IMPLANT
TRAY FOLEY CATH 14FRSI W/METER (CATHETERS) ×3 IMPLANT
WATER STERILE IRR 1500ML POUR (IV SOLUTION) ×3 IMPLANT
WRAP KNEE MAXI GEL POST OP (GAUZE/BANDAGES/DRESSINGS) ×3 IMPLANT

## 2014-03-18 NOTE — Anesthesia Preprocedure Evaluation (Addendum)
Anesthesia Evaluation  Patient identified by MRN, date of birth, ID band Patient awake    Reviewed: Allergy & Precautions, H&P , NPO status , Patient's Chart, lab work & pertinent test results  History of Anesthesia Complications (+) PONV  Airway Mallampati: II TM Distance: >3 FB Neck ROM: Full    Dental no notable dental hx. (+) Teeth Intact, Dental Advisory Given   Pulmonary neg pulmonary ROS, former smoker,  breath sounds clear to auscultation  Pulmonary exam normal       Cardiovascular hypertension, Pt. on medications Rhythm:Regular Rate:Normal     Neuro/Psych negative neurological ROS  negative psych ROS   GI/Hepatic negative GI ROS, Neg liver ROS, GERD-  Medicated and Controlled,  Endo/Other  negative endocrine ROS  Renal/GU negative Renal ROS  negative genitourinary   Musculoskeletal negative musculoskeletal ROS (+)   Abdominal   Peds negative pediatric ROS (+)  Hematology negative hematology ROS (+)   Anesthesia Other Findings   Reproductive/Obstetrics negative OB ROS                         Anesthesia Physical Anesthesia Plan  ASA: II  Anesthesia Plan: General   Post-op Pain Management:    Induction: Intravenous  Airway Management Planned: Oral ETT  Additional Equipment:   Intra-op Plan:   Post-operative Plan: Extubation in OR  Informed Consent:   Plan Discussed with: Surgeon  Anesthesia Plan Comments:        Anesthesia Quick Evaluation

## 2014-03-18 NOTE — Evaluation (Signed)
Physical Therapy Evaluation Patient Details Name: [name] MRN: [id number] DOB: [birth year] Today's Date: 03/18/2014   History of Present Illness  L TKA  Clinical Impression  Pt c/o dizziness upon sitting up but tolerated getting to recliner. BP   WFL, not low. Pt will benefit from PT to address problems listed. Plans to go to snf  rehab.    Follow Up Recommendations SNF    Equipment Recommendations  None recommended by PT    Recommendations for Other Services       Precautions / Restrictions Precautions Precautions: Fall;Knee Required Braces or Orthoses: Knee Immobilizer - Left      Mobility  Bed Mobility Overal bed mobility: Needs Assistance Bed Mobility: Supine to Sit     Supine to sit: Min assist     General bed mobility comments: cues for technique  Transfers Overall transfer level: Needs assistance Equipment used: Rolling walker (2 wheeled) Transfers: Sit to/from BJ'sStand;Stand Pivot Transfers Sit to Stand: +2 safety/equipment;Mod assist Stand pivot transfers: +2 safety/equipment;Mod assist       General transfer comment: cues for safety, position of  UE/L leg, pt c/o dizziness sot limited to recliner  Ambulation/Gait                Stairs            Wheelchair Mobility    Modified Rankin (Stroke Patients Only)       Balance                                             Pertinent Vitals/Pain Pain is < 3    Home Living Family/patient expects to be discharged to:: Skilled nursing facility Living Arrangements: Alone                    Prior Function Level of Independence: Independent               Hand Dominance        Extremity/Trunk Assessment   Upper Extremity Assessment: Overall WFL for tasks assessed           Lower Extremity Assessment: LLE deficits/detail   LLE Deficits / Details: able to perform SLR     Communication   Communication: No difficulties  Cognition  Arousal/Alertness: Awake/alert Behavior During Therapy: WFL for tasks assessed/performed Overall Cognitive Status: Within Functional Limits for tasks assessed                      General Comments      Exercises        Assessment/Plan    PT Assessment Patient needs continued PT services  PT Diagnosis Difficulty walking;Acute pain   PT Problem List Decreased strength;Decreased range of motion;Decreased activity tolerance;Decreased mobility;Decreased knowledge of use of DME;Decreased safety awareness;Decreased knowledge of precautions  PT Treatment Interventions DME instruction;Gait training;Functional mobility training;Therapeutic activities;Therapeutic exercise;Patient/family education   PT Goals (Current goals can be found in the Care Plan section) Acute Rehab PT Goals Patient Stated Goal: I want to get up and walk withoput pain PT Goal Formulation: With patient Time For Goal Achievement: 03/25/14 Potential to Achieve Goals: Good    Frequency 7X/week   Barriers to discharge Decreased caregiver support      Co-evaluation               End of Session Equipment Utilized  During Treatment: Gait belt Activity Tolerance: Patient limited by fatigue;Treatment limited secondary to medical complications (Comment) Patient left: in chair;with call bell/phone within reach Nurse Communication: Mobility status (dizziness.)         Time: [id number] PT Time Calculation (min): 18 min   Charges:   PT Evaluation $Initial PT Evaluation Tier I: 1 Procedure PT Treatments $Gait Training: 8-22 mins   PT G Codes:          Rada HayKaren Elizabeth Zyron Deeley 03/18/2014, 6:20 PM Blanchard KelchKaren Bama Hanselman PT [phone]

## 2014-03-18 NOTE — Progress Notes (Signed)
Clinical Social Work Department CLINICAL SOCIAL WORK PLACEMENT NOTE 03/18/2014  Patient:  [name],[name]  Account Number:  [id number] Admit date:  03/18/2014  Clinical Social Worker:  Cori RazorJAMIE Braya Habermehl, LCSW  Date/time:  03/18/2014 04:46 PM  Clinical Social Work is seeking post-discharge placement for this patient at the following level of care:   SKILLED NURSING   (*CSW will update this form in Epic as items are completed)     Patient/family provided with [facility] Department of Clinical Social Work's list of facilities offering this level of care within the geographic area requested by the patient (or if unable, by the patient's family).  03/18/2014  Patient/family informed of their freedom to choose among providers that offer the needed level of care, that participate in Medicare, Medicaid or managed care program needed by the patient, have an available bed and are willing to accept the patient.  03/18/2014  Patient/family informed of [facility]' ownership interest in [facility], as well as of the fact that they are under no obligation to receive care at this facility.  PASARR submitted to EDS on  PASARR number received from EDS on   FL2 transmitted to all facilities in geographic area requested by pt/family on  03/18/2014 FL2 transmitted to all facilities within larger geographic area on   Patient informed that his/her managed care company has contracts with or will negotiate with  certain facilities, including the following:     Patient/family informed of bed offers received:   Patient chooses bed at  Physician recommends and patient chooses bed at    Patient to be transferred to  on   Patient to be transferred to facility by   The following physician request were entered in Epic:   Additional Comments:  Cori RazorJamie Elkin Belfield LCSW [phone]

## 2014-03-18 NOTE — Transfer of Care (Signed)
Immediate Anesthesia Transfer of Care Note  Patient: [name]  Procedure(s) Performed: Procedure(s): LEFT TOTAL KNEE ARTHROPLASTY (Left)  Patient Location: PACU  Anesthesia Type:General  Level of Consciousness: awake, alert , oriented and pateint uncooperative  Airway & Oxygen Therapy: Patient Spontanous Breathing and Patient connected to face mask oxygen  Post-op Assessment: Report given to PACU RN and Post -op Vital signs reviewed and stable  Post vital signs: Reviewed and stable  Complications: No apparent anesthesia complications

## 2014-03-18 NOTE — Progress Notes (Signed)
Saturday am 03/16/2014 went to ER with abdominal pain and N/V and diarrhea treated with antibiotics R/O diverticulitis. Condition improved.

## 2014-03-18 NOTE — Interval H&P Note (Signed)
History and Physical Interval Note:  03/18/2014 8:41 AM  Holly RusselEileen M Liu  has presented today for surgery, with the diagnosis of OA LEFT KNEE  The various methods of treatment have been discussed with the patient and family. After consideration of risks, benefits and other options for treatment, the patient has consented to  Procedure(s): LEFT TOTAL KNEE ARTHROPLASTY (Left) as a surgical intervention .  The patient's history has been reviewed, patient examined, no change in status, stable for surgery.  I have reviewed the patient's chart and labs.  Questions were answered to the patient's satisfaction.     Gus RankinFrank V Zaniya Mcaulay

## 2014-03-18 NOTE — Progress Notes (Signed)
Utilization review completed.  

## 2014-03-18 NOTE — Op Note (Signed)
Pre-operative diagnosis- Osteoarthritis  Left knee(s)  Post-operative diagnosis- Osteoarthritis Left knee(s)  Procedure-  Left  Total Knee Arthroplasty  Surgeon- Gus RankinFrank V. Kaley Jutras, MD  Assistant- Avel Peacerew Perkins, PA-C   Anesthesia-  General  EBL-* No blood loss amount entered *   Drains Hemovac  Tourniquet time- 32 minutes @ 300 mm Hg  Complications- None  Condition-PACU - hemodynamically stable.   Brief Clinical Note  [name] is a 68 y.o. year old female with end stage OA of her left knee with progressively worsening pain and dysfunction. She has constant pain, with activity and at rest and significant functional deficits with difficulties even with ADLs. She has had extensive non-op management including analgesics, injections of cortisone and viscosupplements, and home exercise program, but remains in significant pain with significant dysfunction. Radiographs show bone on bone arthritis all 3 compartments with varus deformity. She presents now for left Total Knee Arthroplasty.    Procedure in detail---   The patient is brought into the operating room and positioned supine on the operating table. After successful administration of  General,   a tourniquet is placed high on the  Left thigh(s) and the lower extremity is prepped and draped in the usual sterile fashion. Time out is performed by the operating team and then the  Left lower extremity is wrapped in Esmarch, knee flexed and the tourniquet inflated to 300 mmHg.       A midline incision is made with a ten blade through the subcutaneous tissue to the level of the extensor mechanism. A fresh blade is used to make a medial parapatellar arthrotomy. Soft tissue over the proximal medial tibia is subperiosteally elevated to the joint line with a knife and into the semimembranosus bursa with a Cobb elevator. Soft tissue over the proximal lateral tibia is elevated with attention being paid to avoiding the patellar tendon on the tibial  tubercle. The patella is everted, knee flexed 90 degrees and the ACL and PCL are removed. Findings are bone on bone all 3 compartments, worst medially, with large global osteophytes.        The drill is used to create a starting hole in the distal femur and the canal is thoroughly irrigated with sterile saline to remove the fatty contents. The 5 degree Left  valgus alignment guide is placed into the femoral canal and the distal femoral cutting block is pinned to remove 10 mm off the distal femur. Resection is made with an oscillating saw.      The tibia is subluxed forward and the menisci are removed. The extramedullary alignment guide is placed referencing proximally at the medial aspect of the tibial tubercle and distally along the second metatarsal axis and tibial crest. The block is pinned to remove 2mm off the more deficient medial  side. Resection is made with an oscillating saw. Size 3is the most appropriate size for the tibia and the proximal tibia is prepared with the modular drill and keel punch for that size.      The femoral sizing guide is placed and size 4 is most appropriate. Rotation is marked off the epicondylar axis and confirmed by creating a rectangular flexion gap at 90 degrees. The size 4 cutting block is pinned in this rotation and the anterior, posterior and chamfer cuts are made with the oscillating saw. The intercondylar block is then placed and that cut is made.      Trial size 3 tibial component, trial size 4 narrow posterior stabilized femur and  a 12.5  mm posterior stabilized rotating platform insert trial is placed. Full extension is achieved with excellent varus/valgus and anterior/posterior balance throughout full range of motion. The patella is everted and thickness measured to be 22  mm. Free hand resection is taken to 12 mm, a 35 template is placed, lug holes are drilled, trial patella is placed, and it tracks normally. Osteophytes are removed off the posterior femur with the  trial in place. All trials are removed and the cut bone surfaces prepared with pulsatile lavage. Cement is mixed and once ready for implantation, the size 3 tibial implant, size  4 narrow posterior stabilized femoral component, and the size 35 patella are cemented in place and the patella is held with the clamp. The trial insert is placed and the knee held in full extension. The Exparel (20 ml mixed with 30 ml saline) and .25% Bupivicaine, are injected into the extensor mechanism, posterior capsule, medial and lateral gutters and subcutaneous tissues.  All extruded cement is removed and once the cement is hard the permanent 12.5 mm posterior stabilized rotating platform insert is placed into the tibial tray.      The wound is copiously irrigated with saline solution and the extensor mechanism closed over a hemovac drain with #1 V-loc suture. The tourniquet is released for a total tourniquet time of 32  minutes. Flexion against gravity is 140 degrees and the patella tracks normally. Subcutaneous tissue is closed with 2.0 vicryl and subcuticular with running 4.0 Monocryl. The incision is cleaned and dried and steri-strips and a bulky sterile dressing are applied. The limb is placed into a knee immobilizer and the patient is awakened and transported to recovery in stable condition.      Please note that a surgical assistant was a medical necessity for this procedure in order to perform it in a safe and expeditious manner. Surgical assistant was necessary to retract the ligaments and vital neurovascular structures to prevent injury to them and also necessary for proper positioning of the limb to allow for anatomic placement of the prosthesis.   Gus RankinFrank V. Jerilynn Feldmeier, MD    03/18/2014, 11:32 AM

## 2014-03-18 NOTE — Progress Notes (Signed)
Clinical Social Work Department BRIEF PSYCHOSOCIAL ASSESSMENT 03/18/2014  Patient:  [name], [name]     Account Number:  [id number]     Admit date:  03/18/2014  Clinical Social Worker:  Lacie Scotts  Date/Time:  03/18/2014 04:37 PM  Referred by:  Physician  Date Referred:  03/18/2014 Referred for  SNF Placement   Other Referral:   Interview type:  Patient Other interview type:    PSYCHOSOCIAL DATA Living Status:  ALONE Admitted from facility:   Level of care:   Primary support name:  Shanna Guggenheim-Burgess Primary support relationship to patient:  CHILD, ADULT Degree of support available:   supportive    CURRENT CONCERNS Current Concerns  Post-Acute Placement   Other Concerns:    SOCIAL WORK ASSESSMENT / PLAN Pt is a 68 yr old female living at home prior to hospitalization. CSW met with pt and Daughter to assist with d/c planning. This is a planned admission. Pt will need ST Rehab following hospital d/c. Pt has made prior arrangements for rehab with [facility]. CSW is in the process of confirming d/c plan with SNF. CSW will continue to follow to assist with d/c planning to SNF.   Assessment/plan status:  Psychosocial Support/Ongoing Assessment of Needs Other assessment/ plan:   Information/referral to community resources:   Insurance coverage for SNF and ambulance transport reviewed.    PATIENT'S/FAMILY'S RESPONSE TO PLAN OF CARE: Pt is happy surgery is over. She is calm, mood is bright. Pt is looking forward to rehab at [facility].    Werner Lean LCSW [phone]

## 2014-03-18 NOTE — Anesthesia Postprocedure Evaluation (Addendum)
  Anesthesia Post-op Note  Patient: [name]  Procedure(s) Performed: Procedure(s) (LRB): LEFT TOTAL KNEE ARTHROPLASTY (Left)  Patient Location: PACU  Anesthesia Type: General  Level of Consciousness: awake and alert   Airway and Oxygen Therapy: Patient Spontanous Breathing  Post-op Pain: mild  Post-op Assessment: Post-op Vital signs reviewed, Patient's Cardiovascular Status Stable, Respiratory Function Stable, Patent Airway and No signs of Nausea or vomiting  Last Vitals:  Filed Vitals:   03/18/14 1605  BP: 149/74  Pulse: 66  Temp: 36.7 C  Resp: 16    Post-op Vital Signs: stable   Complications: No apparent anesthesia complications

## 2014-03-18 NOTE — H&P (View-Only) (Signed)
[name] DOB: Sep 29, 1946 Divorced / Language: English / Race: Black or African American Female  Date of Admission:  03-18-2014  Chief Complaint:  Left Knee Pain  History of Present Illness The patient is scheduled for a left total knee arthroplasty to be performed by Dr. Gus RankinFrank V. Aluisio, MD at [facility] on 03-18-2014. The patient is a 68 year old female who presents with knee complaints. The patient is seen for a second opinion. The patient reports left knee symptoms including: pain which began year(s) ago without any known injury.The patient feels that the symptoms are worsening. The patient has the current diagnosis of knee osteoarthritis. Prior to being seen today the patient was previously evaluated by a colleague [place]. Manson [place]Brown [place] Previous work-up for this problem has included knee x-rays. Past treatment for this problem has included intra-articular injection of corticosteroids and physical therapy. and include knee pain and difficulty ambulating (especially up or down steps). Current treatment includes nonsteroidal anti-inflammatory drugs (ibuprofen or aspirin). Risk factors include total knee replacement (Right TKA done in 2009. She cannot remember the name of her surgeon). [name] came in on referral from her PCP for continued left knee pain. She kas a previous right total knee replacement done back in 2009 and its doing pretty well. The left knee has gotten worse over thepast couple of months especially since October of 2014 when she had s severe flare up taking her to her PCP and then reff[place] to [place]. Her xrays done at that time showed bone on bone arthritis already. It was recommended she have a knee replacement done but she wanted a second opinion. She had been seen here in our practice for her neck in the past and wanted to come here. Referral from her PCP. She comes in with xrays from October. Her pain is  not severe but has been progressive. Most of her pain is in the proximal tibia and lateral over the outside ligament. The left knee is not quite as bad as the rihgt knee when she had it replaced but it has gotten worse. She describes "crunching" with the knee but no swelling, locking and fortunately not buckling. She does have some night pain and the knee is starting to have more constant pain at this time. She con go up her 15 steps at home fairly well but ging down her steps are more difficult. She does not have a lot of sedinatry stiffness but does have some pain getting up after sitting for a while. She is using OTC meds and has tried heat with some releif. No ice has been used. It not unbearable but progressive. She has a trip planned last fall but had to cancel it becuase of the knee flare up. She is now ready to proceed with the knee replacement on the left. They have been treated conservatively in the past for the above stated problem and despite conservative measures, they continue to have progressive pain and severe functional limitations and dysfunction. They have failed non-operative management including home exercise, medications, and injections. It is felt that they would benefit from undergoing total joint replacement. Risks and benefits of the procedure have been discussed with the patient and they elect to proceed with surgery. There are no active contraindications to surgery such as ongoing infection or rapidly progressive neurological disease.   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 Lymphocytic Gastroesophageal Reflux Disease Chronic Pain Migraine Headache High blood pressure Cataract Hiatal Hernia Hemorrhoids Chronic Gastritis Neck Pain   Family History Cerebrovascular Accident. sister Diabetes  Mellitus. sister Cancer. Sister. sister    Social History Illicit drug use. no Pain Contract. no Children. 2 Current work status. 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 divorced Tobacco / smoke exposure. no Tobacco use. Former smoker. former smoker; smoke(d) less than 1/2 pack(s) per day Alcohol use. current drinker; drinks wine and hard liquor; only occasionally per week Drug/Alcohol Rehab (Previously). no Exercise. Exercises rarely; does running / walking Drug/Alcohol Rehab (Currently). no Number of flights of stairs before winded. 1 Advance Directives. Healthcare POA Post-Surgical Plans. [facility]    Medication History Tribenzor (20-5-12.5MG  Tablet, Oral) Active. Omeprazole (40MG  Capsule DR, Oral) Active. Fluticasone Propionate (50MCG/ACT Suspension, Nasal) Active. Claritin (10MG  Tablet, Oral) Active. Aspirin Buffered (325MG  Tablet, Oral) Active. Ibuprofen (200MG  Tablet, Oral) Active. Medications Reconciled.   Past Surgical History Total Knee Replacement. Date: 07/2008. right Colectomy. Date: 2003. partial Cataract Surgery. Date: 02/2009. right Hysterectomy. Date: 65. complete (cancerous) Colon Polyp Removal - Colonoscopy Hiatal Hernia Repair. Date: 2004. Incisional Hernia Repair. Date: 2006. Repair of Right Detached Retina. Date: 01/2008.   Review of Systems General:Not Present- Chills, Fever, Night Sweats, Fatigue, Weight Gain, Weight Loss and Memory Loss. Skin:Not Present- Hives, Itching, Rash, Eczema and Lesions. HEENT:Present- Headache. Not Present- Tinnitus, Double Vision, Visual Loss, Hearing Loss and Dentures. Respiratory:Not Present- Shortness of breath with exertion, Shortness of breath at rest, Allergies, Coughing up blood and Chronic Cough. Cardiovascular:Not Present- Chest Pain, Racing/skipping heartbeats, Difficulty Breathing Lying Down, Murmur, Swelling and  Palpitations. Gastrointestinal:Present- Abdominal Pain and Constipation. Not Present- Bloody Stool, Heartburn, Vomiting, Nausea, Diarrhea, Difficulty Swallowing, Jaundice and Loss of appetitie. Female Genitourinary:Not Present- Blood in Urine, Urinary frequency, Weak urinary stream, Discharge, Flank Pain, Incontinence, Painful Urination, Urgency, Urinary Retention and Urinating at Night. Musculoskeletal:Present- Back Pain, Morning Stiffness and Spasms. Not Present- Muscle Weakness, Muscle Pain, Joint Swelling and Joint Pain. Neurological:Not Present- Tremor, Dizziness, Blackout spells, Paralysis, Difficulty with balance and Weakness. Psychiatric:Not Present- Insomnia.    Vitals Weight: 163 lb Height: 59 in Weight was reported by patient. Height was reported by patient. Body Surface Area: 1.75 m Body Mass Index: 32.92 kg/m Pulse: 72 (Regular) Resp.: 14 (Unlabored) BP: 142/78 (Sitting, Left Arm, Standard)     Physical Exam The physical exam findings are as follows:   General Mental Status - Alert, cooperative and good historian. General Appearance- pleasant. Not in acute distress. Orientation- Oriented X3. Build & Nutrition- Well nourished and Well developed.   Head and Neck Head- normocephalic, atraumatic . Neck Global Assessment- supple. no bruit auscultated on the right and no bruit auscultated on the left.   Eye Pupil- Bilateral- Regular and Round. Motion- Bilateral- EOMI.   Chest and Lung Exam Auscultation: Breath sounds:- clear at anterior chest wall and - clear at posterior chest wall. Adventitious sounds:- No Adventitious sounds.   Cardiovascular Auscultation:Rhythm- Regular rate and rhythm. Heart Sounds- S1 WNL and S2 WNL. Murmurs & Other Heart Sounds:Auscultation of the heart reveals - No Murmurs.   Abdomen Palpation/Percussion:Tenderness- Abdomen is non-tender to palpation. Rigidity (guarding)- Abdomen is  soft. Auscultation:Auscultation of the abdomen reveals - Bowel sounds normal.   Female Genitourinary Not done, not pertinent to present illness  Musculoskeletal Her right knee looks fine. Her range of motion is 0-105 degrees with no tenderness or instability.  Left knee with  no effusion. Range 5-105 degrees with marked crepitus on range of motion. There is medial joint line tenderness of the left knee. There is no lateral tenderness. No instability.  RADIOGRAPHS AP of both knees and lateral show the prosthesis on the right in good position with no abnormalities. On the left she has significant bone on bone arthritis of the medial and patellofemoral compartments with varus deformity and large osteophytes.   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 Lequita HaltAluisio.  [facility]  PCP - Dr. Delorise JacksonValencia Eggleston-Clark - Patient has been seen preoperatively and felt to be stable for surgery. GI - Dr. Molli PoseyJuothi Mann  The patient will not receive TXA (tranexamic acid) due to: Cancer History  Please note that the patient states that she gets nauseated with anesthesia.   Signed electronically by Lauraine RinneAlexzandrew L Perkins, III PA-C

## 2014-03-19 ENCOUNTER — Encounter (HOSPITAL_COMMUNITY): Payer: Self-pay | Admitting: Orthopedic Surgery

## 2014-03-19 DIAGNOSIS — E876 Hypokalemia: Secondary | ICD-10-CM

## 2014-03-19 DIAGNOSIS — D62 Acute posthemorrhagic anemia: Secondary | ICD-10-CM

## 2014-03-19 HISTORY — DX: Hypokalemia: E87.6

## 2014-03-19 HISTORY — DX: Acute posthemorrhagic anemia: D62

## 2014-03-19 LAB — CBC
HCT: 28.7 % — ABNORMAL LOW (ref 36.0–46.0)
Hemoglobin: 9.3 g/dL — ABNORMAL LOW (ref 12.0–15.0)
MCH: 27.5 pg (ref 26.0–34.0)
MCHC: 32.4 g/dL (ref 30.0–36.0)
MCV: 84.9 fL (ref 78.0–100.0)
Platelets: 216 10*3/uL (ref 150–400)
RBC: 3.38 MIL/uL — AB (ref 3.87–5.11)
RDW: 13.5 % (ref 11.5–15.5)
WBC: 14 10*3/uL — ABNORMAL HIGH (ref 4.0–10.5)

## 2014-03-19 LAB — BASIC METABOLIC PANEL
BUN: 9 mg/dL (ref 6–23)
CHLORIDE: 100 meq/L (ref 96–112)
CO2: 25 meq/L (ref 19–32)
Calcium: 8.7 mg/dL (ref 8.4–10.5)
Creatinine, Ser: 0.7 mg/dL (ref 0.50–1.10)
GFR calc Af Amer: >90 mL/min (ref >90)
GFR, EST NON AFRICAN AMERICAN: 86 mL/min — AB (ref >90)
Glucose, Bld: 119 mg/dL — ABNORMAL HIGH (ref 70–99)
Potassium: 3.5 mEq/L — ABNORMAL LOW (ref 3.7–5.3)
Sodium: 139 mEq/L (ref 137–147)

## 2014-03-19 MED ORDER — IRBESARTAN 150 MG PO TABS
150.0000 mg | ORAL_TABLET | Freq: Every day | ORAL | Status: DC
  Administered 2014-03-19 – 2014-03-21 (×2): 150 mg via ORAL
  Filled 2014-03-19 (×3): qty 1

## 2014-03-19 MED ORDER — NON FORMULARY: 40.0000 mg | Freq: Every morning | Status: DC

## 2014-03-19 MED ORDER — HYDROCHLOROTHIAZIDE 12.5 MG PO CAPS
12.5000 mg | ORAL_CAPSULE | Freq: Every day | ORAL | Status: DC
  Administered 2014-03-19 – 2014-03-21 (×2): 12.5 mg via ORAL
  Filled 2014-03-19 (×3): qty 1

## 2014-03-19 MED ORDER — AMLODIPINE BESYLATE 5 MG PO TABS
5.0000 mg | ORAL_TABLET | Freq: Every day | ORAL | Status: DC
  Administered 2014-03-19 – 2014-03-21 (×2): 5 mg via ORAL
  Filled 2014-03-19 (×3): qty 1

## 2014-03-19 MED ORDER — OMEPRAZOLE 20 MG PO CPDR
40.0000 mg | DELAYED_RELEASE_CAPSULE | Freq: Every day | ORAL | Status: DC
  Administered 2014-03-19 – 2014-03-21 (×3): 40 mg via ORAL
  Filled 2014-03-19 (×3): qty 2

## 2014-03-19 NOTE — Progress Notes (Signed)
OT Cancellation Note  Patient Details Name: [name] MRN: [id number] DOB: [birth year]   Cancelled Treatment:    Reason Eval/Treat Not Completed: Other (comment)  Pt is Medicare/Medicaid and current D/C plan is SNF. No apparent immediate acute care OT needs, therefore will defer OT to SNF. If OT eval is needed please call Acute Rehab Dept. at [phone]  [facility] [name] 03/19/2014, 7:22 AM Marica OtterMaryellen Marshawn Normoyle, OTR/L [phone] 03/19/2014

## 2014-03-19 NOTE — Discharge Instructions (Addendum)
° °Dr. Frank Aluisio °Total Joint Specialist °[facility] °[address] °[place], [place] [address] °[phone] ° °TOTAL KNEE REPLACEMENT POSTOPERATIVE DIRECTIONS ° ° ° °Knee Rehabilitation, Guidelines Following Surgery  °Results after knee surgery are often greatly improved when you follow the exercise, range of motion and muscle strengthening exercises prescribed by your doctor. Safety measures are also important to protect the knee from further injury. Any time any of these exercises cause you to have increased pain or swelling in your knee joint, decrease the amount until you are comfortable again and slowly increase them. If you have problems or questions, call your caregiver or physical therapist for advice.  ° °HOME CARE INSTRUCTIONS  °Remove items at home which could result in a fall. This includes throw rugs or furniture in walking pathways.  °Continue medications as instructed at time of discharge. °You may have some home medications which will be placed on hold until you complete the course of blood thinner medication.  °You may start showering once you are discharged home but do not submerge the incision under water. Just pat the incision dry and apply a dry gauze dressing on daily. °Walk with walker as instructed.  °You may resume a sexual relationship in one month or when given the OK by  your doctor.  °· Use walker as long as suggested by your caregivers. °· Avoid periods of inactivity such as sitting longer than an hour when not asleep. This helps prevent blood clots.  °You may put full weight on your legs and walk as much as is comfortable.  °You may return to work once you are cleared by your doctor.  °Do not drive a car for 6 weeks or until released by you surgeon.  °· Do not drive while taking narcotics.  °Wear the elastic stockings for three weeks following surgery during the day but you may remove then at night. °Make sure you keep all of your appointments after your  operation with all of your doctors and caregivers. You should call the office at the above phone number and make an appointment for approximately two weeks after the date of your surgery. °Change the dressing daily and reapply a dry dressing each time. °Please pick up a stool softener and laxative for home use as long as you are requiring pain medications. °· Continue to use ice on the knee for pain and swelling from surgery. You may notice swelling that will progress down to the foot and ankle.  This is normal after surgery.  Elevate the leg when you are not up walking on it.   °It is important for you to complete the blood thinner medication as prescribed by your doctor. °· Continue to use the breathing machine which will help keep your temperature down.  It is common for your temperature to cycle up and down following surgery, especially at night when you are not up moving around and exerting yourself.  The breathing machine keeps your lungs expanded and your temperature down. ° °RANGE OF MOTION AND STRENGTHENING EXERCISES  °Rehabilitation of the knee is important following a knee injury or an operation. After just a few days of immobilization, the muscles of the thigh which control the knee become weakened and shrink (atrophy). Knee exercises are designed to build up the tone and strength of the thigh muscles and to improve knee motion. Often times heat used for twenty to thirty minutes before working out will loosen up your tissues and help with improving the   range of motion but do not use heat for the first two weeks following surgery. These exercises can be done on a training (exercise) mat, on the floor, on a table or on a bed. Use what ever works the best and is most comfortable for you Knee exercises include:  °Leg Lifts - While your knee is still immobilized in a splint or cast, you can do straight leg raises. Lift the leg to 60 degrees, hold for 3 sec, and slowly lower the leg. Repeat 10-20 times 2-3  times daily. Perform this exercise against resistance later as your knee gets better.  °Quad and Hamstring Sets - Tighten up the muscle on the front of the thigh (Quad) and hold for 5-10 sec. Repeat this 10-20 times hourly. Hamstring sets are done by pushing the foot backward against an object and holding for 5-10 sec. Repeat as with quad sets.  °A rehabilitation program following serious knee injuries can speed recovery and prevent re-injury in the future due to weakened muscles. Contact your doctor or a physical therapist for more information on knee rehabilitation.  ° °SKILLED REHAB INSTRUCTIONS: °If the patient is transferred to a skilled rehab facility following release from the hospital, a list of the current medications will be sent to the facility for the patient to continue.  When discharged from the skilled rehab facility, please have the facility set up the patient's Home Health Physical Therapy prior to being released. Also, the skilled facility will be responsible for providing the patient with their medications at time of release from the facility to include their pain medication, the muscle relaxants, and their blood thinner medication. If the patient is still at the rehab facility at time of the two week follow up appointment, the skilled rehab facility will also need to assist the patient in arranging follow up appointment in our office and any transportation needs. ° °MAKE SURE YOU:  °Understand these instructions.  °Will watch your condition.  °Will get help right away if you are not doing well or get worse.  ° ° °Pick up stool softner and laxative for home. °Do not submerge incision under water. °May shower. °Continue to use ice for pain and swelling from surgery. ° °Take Xarelto for two and a half more weeks, then discontinue Xarelto. °Once the patient has completed the blood thinner regimen, then take a Baby 81 mg Aspirin daily for three more weeks. ° ° ° °Information on my medicine - XARELTO®  (Rivaroxaban) ° °This medication education was reviewed with me or my healthcare representative as part of my discharge preparation.  The pharmacist that spoke with me during my hospital stay was:  Amanda M Runyon, RPH ° °Why was Xarelto® prescribed for you? °Xarelto® was prescribed for you to reduce the risk of blood clots forming after orthopedic surgery. The medical term for these abnormal blood clots is venous thromboembolism (VTE). ° °What do you need to know about xarelto® ? °Take your Xarelto® ONCE DAILY at the same time every day. °You may take it either with or without food. ° °If you have difficulty swallowing the tablet whole, you may crush it and mix in applesauce just prior to taking your dose. ° °Take Xarelto® exactly as prescribed by your doctor and DO NOT stop taking Xarelto® without talking to the doctor who prescribed the medication.  Stopping without other VTE prevention medication to take the place of Xarelto® may increase your risk of developing a clot. ° °After discharge, you should have regular   check-up appointments with your healthcare provider that is prescribing your Xarelto®.   ° °What do you do if you miss a dose? °If you miss a dose, take it as soon as you remember on the same day then continue your regularly scheduled once daily regimen the next day. Do not take two doses of Xarelto® on the same day.  ° °Important Safety Information °A possible side effect of Xarelto® is bleeding. You should call your healthcare provider right away if you experience any of the following: °  Bleeding from an injury or your nose that does not stop. °  Unusual colored urine (red or dark brown) or unusual colored stools (red or black). °  Unusual bruising for unknown reasons. °  A serious fall or if you hit your head (even if there is no bleeding). ° °Some medicines may interact with Xarelto® and might increase your risk of bleeding while on Xarelto®. To help avoid this, consult your healthcare provider or  pharmacist prior to using any new prescription or non-prescription medications, including herbals, vitamins, non-steroidal anti-inflammatory drugs (NSAIDs) and supplements. ° °This website has more information on Xarelto®: www.xarelto.com. ° ° ° °

## 2014-03-19 NOTE — Progress Notes (Signed)
Physical Therapy Treatment Patient Details Name: [name] MRN: [id number] DOB: Nov 26, 1945 Today's Date: 03/19/2014    History of Present Illness L TKA    PT Comments    POD # 1 pm session.  Assisted pt out of recliner to amb to BR then amb in hallway before assisting back to bed for CPM.    Follow Up Recommendations  SNF [facility]     Equipment Recommendations       Recommendations for Other Services       Precautions / Restrictions Precautions Precautions: Fall;Knee Precaution Comments: Instructed pt on KI use for amb Required Braces or Orthoses: Knee Immobilizer - Left Restrictions Weight Bearing Restrictions: No Other Position/Activity Restrictions: WBAT    Mobility  Bed Mobility Overal bed mobility: Needs Assistance Bed Mobility: Sit to Supine     Supine to sit: Min assist     General bed mobility comments: cues for technique and increased time  Transfers Overall transfer level: Needs assistance Equipment used: Rolling walker (2 wheeled) Transfers: Sit to/from Stand Sit to Stand: Min assist         General transfer comment: cues for safety and increased time.  Ambulation/Gait Ambulation/Gait assistance: Min assist Ambulation Distance (Feet): 53 Feet Assistive device: Rolling walker (2 wheeled) Gait Pattern/deviations: Step-to pattern Gait velocity: decreased   General Gait Details: 50% VC's on proper sequencing and increased time.   Stairs            Wheelchair Mobility    Modified Rankin (Stroke Patients Only)       Balance                                    Cognition                            Exercises      General Comments        Pertinent Vitals/Pain C/o 3/10    Home Living                      Prior Function            PT Goals (current goals can now be found in the care plan section) Progress towards PT goals: Progressing toward goals     Frequency  7X/week    PT Plan      Co-evaluation             End of Session Equipment Utilized During Treatment: Gait belt;Right knee immobilizer Activity Tolerance: Patient tolerated treatment well Patient left: in bed;with call bell/phone within reach     Time: 1410-1435 PT Time Calculation (min): 25 min  Charges:  $Gait Training: 8-22 mins $Therapeutic Exercise: 8-22 mins $Therapeutic Activity: 8-22 mins                    G Codes:      [name]  PTA [facility]  Acute  Rehab Pager      [phone]

## 2014-03-19 NOTE — Progress Notes (Signed)
Physical Therapy Treatment Patient Details Name: [name] MRN: [id number] DOB: [birth year] Today's Date: 03/19/2014    History of Present Illness L TKA    PT Comments    POD # 1 am session.  Applied and instructed pt KI use for amb. Assisted OOB to amb to BR then in hallway.  Performed AP, knees presses and towel squeezes.  Followed by [facility].    Follow Up Recommendations  [place] [place]     Equipment Recommendations       Recommendations for Other Services       Precautions / Restrictions Precautions Precautions: Fall;Knee Precaution Comments: Instructed pt on KI use for amb Required Braces or Orthoses: Knee Immobilizer - Left Restrictions Weight Bearing Restrictions: No Other Position/Activity Restrictions: WBAT    Mobility  Bed Mobility Overal bed mobility: Needs Assistance Bed Mobility: Supine to Sit     Supine to sit: Min assist     General bed mobility comments: cues for technique and increased time  Transfers Overall transfer level: Needs assistance Equipment used: Rolling walker (2 wheeled) Transfers: Sit to/from Stand Sit to Stand: Min assist         General transfer comment: cues for safety and increased time.  Ambulation/Gait Ambulation/Gait assistance: Min assist Ambulation Distance (Feet): 42 Feet Assistive device: Rolling walker (2 wheeled) Gait Pattern/deviations: Step-to pattern Gait velocity: decreased   General Gait Details: 50% VC's on proper sequencing and increased time.   Stairs            Wheelchair Mobility    Modified Rankin (Stroke Patients Only)       Balance                                    Cognition                            Exercises  10 reps AP B LE 10 reps knee presses B LE 10 reps towel squeezes B LE    General Comments        Pertinent Vitals/Pain     Home Living                      Prior Function            PT Goals  (current goals can now be found in the care plan section) Progress towards PT goals: Progressing toward goals    Frequency  7X/week    PT Plan      Co-evaluation             End of Session Equipment Utilized During Treatment: Gait belt;Right knee immobilizer Activity Tolerance: Patient limited by fatigue Patient left: in chair;with call bell/phone within reach     Time: 1010-1036 PT Time Calculation (min): 26 min  Charges:  $Gait Training: 8-22 mins $Therapeutic Activity: 8-22 mins                    G Codes:      Felecia ShellingLori Atlas [place]  PTA [facility]  Acute  Rehab Pager      [phone]

## 2014-03-19 NOTE — Progress Notes (Signed)
CSW assisting with d/c planning. [facility] [place] [facility] Re[facility] bed for pt on Thursday if pt is stable for d/c. CSW will continue to follow to assist with d/c planning needs.  Cori RazorJamie Riot [place] LCSW [phone]

## 2014-03-19 NOTE — Progress Notes (Signed)
   Subjective: 1 Day Post-Op Procedure(s) (LRB): LEFT TOTAL KNEE ARTHROPLASTY (Left) Patient reports pain as mild.   Patient seen in rounds with Dr. Lequita HaltAluisio. Patient is well, but has had some minor complaints of pain in the knee, requiring pain medications We will start therapy today.  Plan is to go [facility] after hospital stay.  Objective: Vital signs in last 24 hours: Temp:  [97.8 F (36.6 C)-98.8 F (37.1 C)] 98 F (36.7 C) (05/05 0500) Pulse Rate:  [51-69] 51 (05/05 0500) Resp:  [9-18] 16 (05/05 0500) BP: (127-165)/(63-82) 146/75 mmHg (05/05 0500) SpO2:  [98 %-100 %] 100 % (05/05 0500)  Intake/Output from previous day:  Intake/Output Summary (Last 24 hours) at 03/19/14 0727 Last data filed at 03/19/14 0659  Gross per 24 hour  Intake 3508.75 ml  Output   2022 ml  Net 1486.75 ml    Intake/Output this shift:    Labs:  Recent Labs  03/19/14 0625  HGB 9.3*    Recent Labs  03/19/14 0625  WBC 14.0*  RBC 3.38*  HCT 28.7*  PLT 216    Recent Labs  03/19/14 0520  NA 139  K 3.5*  CL 100  CO2 25  BUN 9  CREATININE 0.70  GLUCOSE 119*  CALCIUM 8.7   No results found for this basename: LABPT, INR,  in the last 72 hours  EXAM General - Patient is Alert, Appropriate and Oriented Extremity - Neurovascular intact Sensation intact distally Dressing - dressing C/D/I Motor Function - intact, moving foot and toes well on exam.  Hemovac pulled without difficulty.  Past Medical History  Diagnosis Date  . PONV (postoperative nausea and vomiting)   . Hypertension   . GERD (gastroesophageal reflux disease)   . Anxiety   . Headache(784.0)   . H/O hiatal hernia   . Arthritis     Assessment/Plan: 1 Day Post-Op Procedure(s) (LRB): LEFT TOTAL KNEE ARTHROPLASTY (Left) Principal Problem:   OA (osteoarthritis) of knee  There is no weight on file to calculate BMI. Advance diet Up with therapy Discharge to SNF  DVT Prophylaxis -  Xarelto Weight-Bearing as tolerated to left leg D/C O2 and Pulse OX and try on Room Air  Avel Peacerew Perkins, PA-C Orthopaedic Surgery 03/19/2014, 7:27 AM

## 2014-03-20 LAB — BASIC METABOLIC PANEL
BUN: 12 mg/dL (ref 6–23)
CALCIUM: 9.2 mg/dL (ref 8.4–10.5)
CO2: 26 mEq/L (ref 19–32)
Chloride: 100 mEq/L (ref 96–112)
Creatinine, Ser: 0.62 mg/dL (ref 0.50–1.10)
GFR calc Af Amer: >90 mL/min (ref >90)
GFR, EST NON AFRICAN AMERICAN: 89 mL/min — AB (ref >90)
GLUCOSE: 128 mg/dL — AB (ref 70–99)
Potassium: 3.4 mEq/L — ABNORMAL LOW (ref 3.7–5.3)
SODIUM: 138 meq/L (ref 137–147)

## 2014-03-20 LAB — CBC
HCT: 28.5 % — ABNORMAL LOW (ref 36.0–46.0)
HEMOGLOBIN: 9.1 g/dL — AB (ref 12.0–15.0)
MCH: 27.2 pg (ref 26.0–34.0)
MCHC: 31.9 g/dL (ref 30.0–36.0)
MCV: 85.1 fL (ref 78.0–100.0)
Platelets: 212 10*3/uL (ref 150–400)
RBC: 3.35 MIL/uL — ABNORMAL LOW (ref 3.87–5.11)
RDW: 13.4 % (ref 11.5–15.5)
WBC: 15.4 10*3/uL — AB (ref 4.0–10.5)

## 2014-03-20 MED ORDER — OXYCODONE HCL 5 MG PO TABS: 5.0000 mg | ORAL_TABLET | ORAL | Status: DC | PRN

## 2014-03-20 MED ORDER — RIVAROXABAN 10 MG PO TABS: 10.0000 mg | ORAL_TABLET | Freq: Every day | ORAL | Status: DC

## 2014-03-20 MED ORDER — TRAMADOL HCL 50 MG PO TABS: 50.0000 mg | ORAL_TABLET | Freq: Four times a day (QID) | ORAL | Status: DC | PRN

## 2014-03-20 MED ORDER — METHOCARBAMOL 500 MG PO TABS: 500.0000 mg | ORAL_TABLET | Freq: Four times a day (QID) | ORAL | Status: DC | PRN

## 2014-03-20 NOTE — Progress Notes (Signed)
Physical Therapy Treatment Patient Details Name: [name] MRN: [id number] DOB: 02-Apr-1946 Today's Date: 03/20/2014    History of Present Illness L TKA    PT Comments    POD #2 am session.  Assisted to BR then amb in hallway.  Pt required increased time and VC's on proper walker to self distance and safety with turns.    Follow Up Recommendations  SNF [facility]     Equipment Recommendations  None recommended by PT    Recommendations for Other Services       Precautions / Restrictions Precautions Precautions: Fall;Knee Precaution Comments: pt able to perform active SLR so D/C KI and instructed pt Restrictions Weight Bearing Restrictions: No Other Position/Activity Restrictions: WBAT    Mobility  Bed Mobility               General bed mobility comments: Pt OOB in recliner  Transfers Overall transfer level: Needs assistance Equipment used: Rolling walker (2 wheeled) Transfers: Sit to/from Stand Sit to Stand: Min assist         General transfer comment: cues for safety and increased time.  Ambulation/Gait Ambulation/Gait assistance: Min assist Ambulation Distance (Feet): 42 Feet Assistive device: Rolling walker (2 wheeled) Gait Pattern/deviations: Step-to pattern Gait velocity: decreased   General Gait Details: 50% VC's on proper sequencing and increased time.plus VC's for proper walker to self distance.   Stairs            Wheelchair Mobility    Modified Rankin (Stroke Patients Only)       Balance                                    Cognition                            Exercises      General Comments        Pertinent Vitals/Pain C/o "soreness"    Home Living                      Prior Function            PT Goals (current goals can now be found in the care plan section) Progress towards PT goals: Progressing toward goals    Frequency  7X/week    PT Plan       Co-evaluation             End of Session Equipment Utilized During Treatment: Gait belt Activity Tolerance: Patient tolerated treatment well Patient left: in chair;with call bell/phone within reach     Time: 1034-1105 PT Time Calculation (min): 31 min  Charges:  $Gait Training: 8-22 mins $Therapeutic Activity: 23-37 mins                    G Codes:      Felecia ShellingLori Shanyn Preisler  PTA [facility]  Acute  Rehab Pager      [phone]

## 2014-03-20 NOTE — Discharge Summary (Signed)
Physician Discharge Summary   Patient ID: [name] MRN: [id number] DOB/AGE: 70-Feb-1947 70 y.o.  Admit date: 03/18/2014 Discharge date: 03-21-2014  Primary Diagnosis:  Osteoarthritis Left knee(s)  Admission Diagnoses:  Past Medical History  Diagnosis Date  . PONV (postoperative nausea and vomiting)   . Hypertension   . GERD (gastroesophageal reflux disease)   . Anxiety   . Headache(784.0)   . H/O hiatal hernia   . Arthritis    Discharge Diagnoses:   Principal Problem:   OA (osteoarthritis) of knee Active Problems:   Hypokalemia   Postoperative anemia due to acute blood loss  Estimated body mass index is 32.7 kg/(m^2) as calculated from the following:   Height as of this encounter: 4' 11"  (1.499 m).   Weight as of this encounter: 73.483 kg (162 lb).  Procedure:  Procedure(s) (LRB): LEFT TOTAL KNEE ARTHROPLASTY (Left)   Consults: None  HPI: [name] is a 68 y.o. year old female with end stage OA of her left knee with progressively worsening pain and dysfunction. She has constant pain, with activity and at rest and significant functional deficits with difficulties even with ADLs. She has had extensive non-op management including analgesics, injections of cortisone and viscosupplements, and home exercise program, but remains in significant pain with significant dysfunction. Radiographs show bone on bone arthritis all 3 compartments with varus deformity. She presents now for left Total Knee Arthroplasty.   Laboratory Data: Admission on 03/18/2014  Component Date Value Ref Range Status  . Sodium 03/19/2014 139  137 - 147 mEq/L Final  . Potassium 03/19/2014 3.5* 3.7 - 5.3 mEq/L Final  . Chloride 03/19/2014 100  96 - 112 mEq/L Final  . CO2 03/19/2014 25  19 - 32 mEq/L Final  . Glucose, Bld 03/19/2014 119* 70 - 99 mg/dL Final  . BUN 03/19/2014 9  6 - 23 mg/dL Final  . Creatinine, Ser 03/19/2014 0.70  0.50 - 1.10 mg/dL Final  . Calcium 03/19/2014 8.7  8.4 - 10.5 mg/dL  Final  . GFR calc non Af Amer 03/19/2014 86* >90 mL/min Final  . GFR calc Af Amer 03/19/2014 >90  >90 mL/min Final   Comment: (NOTE)                          The eGFR has been calculated using the CKD EPI equation.                          This calculation has not been validated in all clinical situations.                          eGFR's persistently <90 mL/min signify possible Chronic Kidney                          Disease.  . WBC 03/19/2014 14.0* 4.0 - 10.5 K/uL Final  . RBC 03/19/2014 3.38* 3.87 - 5.11 MIL/uL Final  . Hemoglobin 03/19/2014 9.3* 12.0 - 15.0 g/dL Final  . HCT 03/19/2014 28.7* 36.0 - 46.0 % Final  . MCV 03/19/2014 84.9  78.0 - 100.0 fL Final  . MCH 03/19/2014 27.5  26.0 - 34.0 pg Final  . MCHC 03/19/2014 32.4  30.0 - 36.0 g/dL Final  . RDW 03/19/2014 13.5  11.5 - 15.5 % Final  . Platelets 03/19/2014 216  150 - 400 K/uL Final  .  WBC 03/20/2014 15.4* 4.0 - 10.5 K/uL Final  . RBC 03/20/2014 3.35* 3.87 - 5.11 MIL/uL Final  . Hemoglobin 03/20/2014 9.1* 12.0 - 15.0 g/dL Final  . HCT 03/20/2014 28.5* 36.0 - 46.0 % Final  . MCV 03/20/2014 85.1  78.0 - 100.0 fL Final  . MCH 03/20/2014 27.2  26.0 - 34.0 pg Final  . MCHC 03/20/2014 31.9  30.0 - 36.0 g/dL Final  . RDW 03/20/2014 13.4  11.5 - 15.5 % Final  . Platelets 03/20/2014 212  150 - 400 K/uL Final  . Sodium 03/20/2014 138  137 - 147 mEq/L Final  . Potassium 03/20/2014 3.4* 3.7 - 5.3 mEq/L Final  . Chloride 03/20/2014 100  96 - 112 mEq/L Final  . CO2 03/20/2014 26  19 - 32 mEq/L Final  . Glucose, Bld 03/20/2014 128* 70 - 99 mg/dL Final  . BUN 03/20/2014 12  6 - 23 mg/dL Final  . Creatinine, Ser 03/20/2014 0.62  0.50 - 1.10 mg/dL Final  . Calcium 03/20/2014 9.2  8.4 - 10.5 mg/dL Final  . GFR calc non Af Amer 03/20/2014 89* >90 mL/min Final  . GFR calc Af Amer 03/20/2014 >90  >90 mL/min Final   Comment: (NOTE)                          The eGFR has been calculated using the CKD EPI equation.                          This  calculation has not been validated in all clinical situations.                          eGFR's persistently <90 mL/min signify possible Chronic Kidney                          Disease.  . WBC 03/21/2014 17.8* 4.0 - 10.5 K/uL Final  . RBC 03/21/2014 3.44* 3.87 - 5.11 MIL/uL Final  . Hemoglobin 03/21/2014 9.5* 12.0 - 15.0 g/dL Final  . HCT 03/21/2014 29.6* 36.0 - 46.0 % Final  . MCV 03/21/2014 86.0  78.0 - 100.0 fL Final  . MCH 03/21/2014 27.6  26.0 - 34.0 pg Final  . MCHC 03/21/2014 32.1  30.0 - 36.0 g/dL Final  . RDW 03/21/2014 13.7  11.5 - 15.5 % Final  . Platelets 03/21/2014 228  150 - 400 K/uL Final  Hospital Outpatient Visit on 03/12/2014  Component Date Value Ref Range Status  . MRSA, PCR 03/12/2014 NEGATIVE  NEGATIVE Final  . Staphylococcus aureus 03/12/2014 NEGATIVE  NEGATIVE Final   Comment:                                 The Xpert SA Assay (FDA                          approved for NASAL specimens                          in patients over 32 years of age),                          is one component of  a comprehensive surveillance                          program.  Test performance has                          been validated by [facility] for patients greater                          than or equal to 82 year old.                          It is not intended                          to diagnose infection nor to                          guide or monitor treatment.  Marland Kitchen aPTT 03/12/2014 26  24 - 37 seconds Final  . WBC 03/12/2014 12.7* 4.0 - 10.5 K/uL Final  . RBC 03/12/2014 4.58  3.87 - 5.11 MIL/uL Final  . Hemoglobin 03/12/2014 12.6  12.0 - 15.0 g/dL Final  . HCT 03/12/2014 38.9  36.0 - 46.0 % Final  . MCV 03/12/2014 84.9  78.0 - 100.0 fL Final  . MCH 03/12/2014 27.5  26.0 - 34.0 pg Final  . MCHC 03/12/2014 32.4  30.0 - 36.0 g/dL Final  . RDW 03/12/2014 13.4  11.5 - 15.5 % Final  . Platelets 03/12/2014 263  150 - 400 K/uL  Final  . Sodium 03/12/2014 141  137 - 147 mEq/L Final  . Potassium 03/12/2014 4.3  3.7 - 5.3 mEq/L Final  . Chloride 03/12/2014 101  96 - 112 mEq/L Final  . CO2 03/12/2014 28  19 - 32 mEq/L Final  . Glucose, Bld 03/12/2014 103* 70 - 99 mg/dL Final  . BUN 03/12/2014 14  6 - 23 mg/dL Final  . Creatinine, Ser 03/12/2014 0.79  0.50 - 1.10 mg/dL Final  . Calcium 03/12/2014 10.2  8.4 - 10.5 mg/dL Final  . Total Protein 03/12/2014 8.2  6.0 - 8.3 g/dL Final  . Albumin 03/12/2014 4.0  3.5 - 5.2 g/dL Final  . AST 03/12/2014 17  0 - 37 U/L Final  . ALT 03/12/2014 13  0 - 35 U/L Final  . Alkaline Phosphatase 03/12/2014 114  39 - 117 U/L Final  . Total Bilirubin 03/12/2014 0.5  0.3 - 1.2 mg/dL Final  . GFR calc non Af Amer 03/12/2014 82* >90 mL/min Final  . GFR calc Af Amer 03/12/2014 >90  >90 mL/min Final   Comment: (NOTE)                          The eGFR has been calculated using the CKD EPI equation.                          This calculation has not been validated in all clinical situations.  eGFR's persistently <90 mL/min signify possible Chronic Kidney                          Disease.  Marland Kitchen Prothrombin Time 03/12/2014 13.6  11.6 - 15.2 seconds Final  . INR 03/12/2014 1.06  0.00 - 1.49 Final  . ABO/RH(D) 03/12/2014 A POS   Final  . Antibody Screen 03/12/2014 NEG   Final  . Sample Expiration 03/12/2014 03/21/2014   Final  . Color, Urine 03/12/2014 YELLOW  YELLOW Final  . APPearance 03/12/2014 CLEAR  CLEAR Final  . Specific Gravity, Urine 03/12/2014 1.019  1.005 - 1.030 Final  . pH 03/12/2014 6.0  5.0 - 8.0 Final  . Glucose, UA 03/12/2014 NEGATIVE  NEGATIVE mg/dL Final  . Hgb urine dipstick 03/12/2014 NEGATIVE  NEGATIVE Final  . Bilirubin Urine 03/12/2014 NEGATIVE  NEGATIVE Final  . Ketones, ur 03/12/2014 NEGATIVE  NEGATIVE mg/dL Final  . Protein, ur 03/12/2014 NEGATIVE  NEGATIVE mg/dL Final  . Urobilinogen, UA 03/12/2014 1.0  0.0 - 1.0 mg/dL Final  . Nitrite  03/12/2014 NEGATIVE  NEGATIVE Final  . Leukocytes, UA 03/12/2014 TRACE* NEGATIVE Final  . Squamous Epithelial / LPF 03/12/2014 RARE  RARE Final  . WBC, UA 03/12/2014 0-2  <3 WBC/hpf Final  . RBC / HPF 03/12/2014 0-2  <3 RBC/hpf Final  . Bacteria, UA 03/12/2014 FEW* RARE Final     X-Rays:Dg Chest 2 View  03/12/2014   CLINICAL DATA:  Preop for left knee replacement.  Hypertension.  EXAM: CHEST  2 VIEW  COMPARISON:  01/23/2008  FINDINGS: Cardiac silhouette is normal in size. Normal mediastinal and hilar contours. Small stable nodule in the right lung base consistent with a granuloma. Lungs are otherwise clear. No pleural effusion. No pneumothorax.  Bony thorax is demineralized but intact.  IMPRESSION: No active cardiopulmonary disease.   Electronically Signed   By: Lajean Manes M.D.   On: 03/12/2014 15:01    EKG:No orders found for this or any previous visit.   Hospital Course: [name] is a 68 y.o. who was admitted to [facility]. They were brought to the operating room on 03/18/2014 and underwent Procedure(s): LEFT TOTAL KNEE ARTHROPLASTY.  Patient tolerated the procedure well and was later transferred to the recovery room and then to the orthopaedic floor for postoperative care.  They were given PO and IV analgesics for pain control following their surgery.  They were given 24 hours of postoperative antibiotics of      Anti-infectives   Start     Dose/Rate Route Frequency Ordered Stop   03/18/14 1630  ceFAZolin (ANCEF) IVPB 2 g/50 mL premix     2 g 100 mL/hr over 30 Minutes Intravenous Every 6 hours 03/18/14 1358 03/18/14 2323   03/18/14 0807  ceFAZolin (ANCEF) IVPB 2 g/50 mL premix     2 g 100 mL/hr over 30 Minutes Intravenous On call to O.R. 03/18/14 0623 03/18/14 1038     and started on DVT prophylaxis in the form of Xarelto.   PT and OT were ordered for total joint protocol.  Discharge planning consulted to help with postop disposition and equipment needs.  Patient had a  decent night on the evening of surgery.  They started to get up OOB with therapy on day one. Hemovac drain was pulled without difficulty.  Continued to work with therapy into day two.  Dressing was changed on day two and the incision was healing well.  By  day three, the patient had progressed with therapy and meeting their goals.  Incision was healing well.  Patient was seen in rounds and was ready to go to [facility].   Discharge to SNF  Diet - Regular diet  Follow up - in 2 weeks following surgery  Activity - WBAT  Disposition - Skilled nursing facility  Condition Upon Discharge - Good  D/C Meds - See DC Summary  DVT Prophylaxis - Xarelto      Medication List    STOP taking these medications       aspirin 325 MG tablet     cholecalciferol 1000 UNITS tablet  Commonly known as:  VITAMIN D     ciprofloxacin 500 MG tablet  Commonly known as:  CIPRO     diphenhydrAMINE 25 mg capsule  Commonly known as:  BENADRYL     ibuprofen 200 MG tablet  Commonly known as:  ADVIL,MOTRIN     ibuprofen 800 MG tablet  Commonly known as:  ADVIL,MOTRIN     metroNIDAZOLE 500 MG tablet  Commonly known as:  FLAGYL      TAKE these medications       ALPRAZolam 0.5 MG tablet  Commonly known as:  XANAX  Take 1 tablet (0.5 mg total) by mouth 2 (two) times daily as needed for anxiety.     fluticasone 50 MCG/ACT nasal spray  Commonly known as:  FLONASE  Place 1 spray into both nostrils 2 (two) times daily.     loratadine 10 MG tablet  Commonly known as:  CLARITIN  Take 10 mg by mouth daily.     methocarbamol 500 MG tablet  Commonly known as:  ROBAXIN  Take 1 tablet (500 mg total) by mouth every 6 (six) hours as needed for muscle spasms.     omeprazole 40 MG capsule  Commonly known as:  PRILOSEC  Take 40 mg by mouth daily.     oxyCODONE 5 MG immediate release tablet  Commonly known as:  Oxy IR/ROXICODONE  Take 1-2 tablets (5-10 mg total) by mouth every 3 (three) hours as needed  for breakthrough pain.     phenylephrine 10 MG Tabs tablet  Commonly known as:  SUDAFED PE  Take 10 mg by mouth 2 (two) times daily as needed (congestion).     promethazine 25 MG tablet  Commonly known as:  PHENERGAN  Take 25 mg by mouth every 6 (six) hours as needed for nausea or vomiting.     rivaroxaban 10 MG Tabs tablet  Commonly known as:  XARELTO  Take 1 tablet (10 mg total) by mouth daily with breakfast.     traMADol 50 MG tablet  Commonly known as:  ULTRAM  Take 1-2 tablets (50-100 mg total) by mouth every 6 (six) hours as needed for moderate pain.     TRIBENZOR 20-5-12.5 MG Tabs  Generic drug:  Olmesartan-Amlodipine-HCTZ  Take 1 tablet by mouth every morning.       Follow-up Information   Follow up with Gearlean Alf, MD. Schedule an appointment as soon as possible for a visit on 04/02/2014. (Call [phone] tomorrow to make the appointment)    Specialty:  Orthopedic Surgery   Contact information:   [address] [place] [address] [phone]       Signed: Arlee Muslim, PA-C Orthopaedic Surgery 03/21/2014, 7:16 AM

## 2014-03-20 NOTE — Progress Notes (Signed)
   Subjective: 2 Days Post-Op Procedure(s) (LRB): LEFT TOTAL KNEE ARTHROPLASTY (Left) Patient reports pain as mild.   Patient seen in rounds with Dr. Lequita HaltAluisio.  She is feeling better today.  Able to get some rest last night. Patient is well, and has had no acute complaints or problems Plan is to go [facility] after hospital stay.  Objective: Vital signs in last 24 hours: Temp:  [98.7 F (37.1 C)-99.4 F (37.4 C)] 99.4 F (37.4 C) (05/06 0553) Pulse Rate:  [66-69] 66 (05/06 0553) Resp:  [16-18] 18 (05/06 0553) BP: (117-136)/(68-73) 129/70 mmHg (05/06 0553) SpO2:  [92 %-96 %] 92 % (05/06 0553)  Intake/Output from previous day:  Intake/Output Summary (Last 24 hours) at 03/20/14 0850 Last data filed at 03/19/14 2345  Gross per 24 hour  Intake 1093.09 ml  Output    925 ml  Net 168.09 ml    Intake/Output this shift:    Labs:  Recent Labs  03/19/14 0625 03/20/14 0507  HGB 9.3* 9.1*    Recent Labs  03/19/14 0625 03/20/14 0507  WBC 14.0* 15.4*  RBC 3.38* 3.35*  HCT 28.7* 28.5*  PLT 216 212    Recent Labs  03/19/14 0520 03/20/14 0507  NA 139 138  K 3.5* 3.4*  CL 100 100  CO2 25 26  BUN 9 12  CREATININE 0.70 0.62  GLUCOSE 119* 128*  CALCIUM 8.7 9.2   No results found for this basename: LABPT, INR,  in the last 72 hours  EXAM General - Patient is Alert, Appropriate and Oriented Extremity - Neurovascular intact Sensation intact distally Dorsiflexion/Plantar flexion intact Dressing/Incision - clean, dry, no drainage, healing Motor Function - intact, moving foot and toes well on exam.   Past Medical History  Diagnosis Date  . PONV (postoperative nausea and vomiting)   . Hypertension   . GERD (gastroesophageal reflux disease)   . Anxiety   . Headache(784.0)   . H/O hiatal hernia   . Arthritis     Assessment/Plan: 2 Days Post-Op Procedure(s) (LRB): LEFT TOTAL KNEE ARTHROPLASTY (Left) Principal Problem:   OA (osteoarthritis) of knee Active  Problems:   Hypokalemia   Postoperative anemia due to acute blood loss  Estimated body mass index is 32.7 kg/(m^2) as calculated from the following:   Height as of this encounter: 4\' 11"  (1.499 m).   Weight as of this encounter: 73.483 kg (162 lb). Advance diet Up with therapy Plan for discharge tomorrow Discharge to SNF  DVT Prophylaxis - Xarelto Weight-Bearing as tolerated to left leg  Avel Peacerew Ica Daye, PA-C Orthopaedic Surgery 03/20/2014, 8:50 AM

## 2014-03-20 NOTE — Progress Notes (Signed)
Physical Therapy Treatment Patient Details Name: [name] MRN: [id number] DOB: 12/27/1945 Today's Date: 03/20/2014    History of Present Illness L TKA    PT Comments    POD # 2 pm session.  Amb pt in hallway, assisted to BR then back to bed to perform TKR TE's.    Follow Up Recommendations  [facility] [facility]     Equipment Recommendations  None recommended by PT    Recommendations for Other Services       Precautions / Restrictions Precautions Precautions: Fall;Knee Precaution Comments: pt able to perform active SLR so D/C KI and instructed pt Restrictions Weight Bearing Restrictions: No Other Position/Activity Restrictions: WBAT    Mobility  Bed Mobility               General bed mobility comments: Pt OOB in recliner  Transfers Overall transfer level: Needs assistance Equipment used: Rolling walker (2 wheeled) Transfers: Sit to/from Stand Sit to Stand: Min assist         General transfer comment: cues for safety and increased time.  Ambulation/Gait Ambulation/Gait assistance: Min assist Ambulation Distance (Feet): 42 Feet Assistive device: Rolling walker (2 wheeled) Gait Pattern/deviations: Step-to pattern Gait velocity: decreased   General Gait Details: 50% VC's on proper sequencing and increased time.plus VC's for proper walker to self distance.   Stairs            Wheelchair Mobility    Modified Rankin (Stroke Patients Only)       Balance                                    Cognition                            Exercises   Total Knee Replacement TE's 10 reps B LE ankle pumps 10 reps towel squeezes 10 reps knee presses 10 reps heel slides  10 reps SAQ's 10 reps SLR's 10 reps ABD Followed by ICE    General Comments        Pertinent Vitals/Pain C/o 3/10 ICE applied    Home Living                      Prior Function            PT Goals (current goals can  now be found in the care plan section) Progress towards PT goals: Progressing toward goals    Frequency  7X/week    PT Plan      Co-evaluation             End of Session Equipment Utilized During Treatment: Gait belt Activity Tolerance: Patient tolerated treatment well Patient left: in chair;with call bell/phone within reach     Time: 1110-1135 PT Time Calculation (min): 25 min  Charges:  $Gait Training: 8-22 mins $Therapeutic Exercise: 8-22 mins $Therapeutic Activity: 23-37 mins                    G Codes:      Felecia ShellingLori Cache Bills  PTA [facility]  Acute  Rehab Pager      [phone]

## 2014-03-21 LAB — CBC
HCT: 29.6 % — ABNORMAL LOW (ref 36.0–46.0)
Hemoglobin: 9.5 g/dL — ABNORMAL LOW (ref 12.0–15.0)
MCH: 27.6 pg (ref 26.0–34.0)
MCHC: 32.1 g/dL (ref 30.0–36.0)
MCV: 86 fL (ref 78.0–100.0)
PLATELETS: 228 10*3/uL (ref 150–400)
RBC: 3.44 MIL/uL — ABNORMAL LOW (ref 3.87–5.11)
RDW: 13.7 % (ref 11.5–15.5)
WBC: 17.8 10*3/uL — ABNORMAL HIGH (ref 4.0–10.5)

## 2014-03-21 MED ORDER — ALPRAZOLAM 0.5 MG PO TABS: 0.5000 mg | ORAL_TABLET | Freq: Two times a day (BID) | ORAL | Status: AC | PRN

## 2014-03-21 NOTE — Progress Notes (Signed)
   Subjective: 3 Days Post-Op Procedure(s) (LRB): LEFT TOTAL KNEE ARTHROPLASTY (Left) Patient reports pain as mild.   Patient seen in rounds for Dr. Lequita HaltAluisio. Patient is well, and has had no acute complaints or problems.  She did a little better last night. Patient is ready to go [facility].  Objective: Vital signs in last 24 hours: Temp:  [97.9 F (36.6 C)-100 F (37.8 C)] 99.6 F (37.6 C) (05/07 0524) Pulse Rate:  [71-82] 71 (05/07 0524) Resp:  [14-18] 16 (05/07 0524) BP: (117-154)/(71-79) 149/73 mmHg (05/07 0524) SpO2:  [95 %] 95 % (05/07 0524)  Intake/Output from previous day:  Intake/Output Summary (Last 24 hours) at 03/21/14 [id number] Last data filed at 03/20/14 2345  Gross per 24 hour  Intake    240 ml  Output   1700 ml  Net  -1460 ml    Intake/Output this shift:    Labs:  Recent Labs  03/19/14 0625 03/20/14 0507 03/21/14 0445  HGB 9.3* 9.1* 9.5*    Recent Labs  03/20/14 0507 03/21/14 0445  WBC 15.4* 17.8*  RBC 3.35* 3.44*  HCT 28.5* 29.6*  PLT 212 228    Recent Labs  03/19/14 0520 03/20/14 0507  NA 139 138  K 3.5* 3.4*  CL 100 100  CO2 25 26  BUN 9 12  CREATININE 0.70 0.62  GLUCOSE 119* 128*  CALCIUM 8.7 9.2   No results found for this basename: LABPT, INR,  in the last 72 hours  EXAM: General - Patient is Alert, Appropriate and Oriented Extremity - Neurovascular intact Sensation intact distally Dorsiflexion/Plantar flexion intact Incision - clean, dry, no drainage, healing Motor Function - intact, moving foot and toes well on exam.   Assessment/Plan: 3 Days Post-Op Procedure(s) (LRB): LEFT TOTAL KNEE ARTHROPLASTY (Left) Procedure(s) (LRB): LEFT TOTAL KNEE ARTHROPLASTY (Left) Past Medical History  Diagnosis Date  . PONV (postoperative nausea and vomiting)   . Hypertension   . GERD (gastroesophageal reflux disease)   . Anxiety   . Headache(784.0)   . H/O hiatal hernia   . Arthritis    Principal Problem:   OA  (osteoarthritis) of knee Active Problems:   Hypokalemia   Postoperative anemia due to acute blood loss  Estimated body mass index is 32.7 kg/(m^2) as calculated from the following:   Height as of this encounter: 4\' 11"  (1.499 m).   Weight as of this encounter: 73.483 kg (162 lb). Discharge to SNF Diet - Regular diet Follow up - in 2 weeks following surgery Activity - WBAT Disposition - Skilled nursing facility Condition Upon Discharge - Good D/C Meds - See DC Summary DVT Prophylaxis - Xarelto  Avel Peacerew Moishy Laday, PA-C Orthopaedic Surgery 03/21/2014, 7:12 AM

## 2014-03-21 NOTE — Progress Notes (Signed)
Physical Therapy Treatment Patient Details Name: [name] MRN: [id number] DOB: January 17, 1946 Today's Date: 03/21/2014    History of Present Illness L TKA    PT Comments    POD # 3 am session.  Pt took a shower earlier and was a lttle tired.  Assisted to BR then amb in hallway with increased time then assisted back to bed.    Follow Up Recommendations  SNF     Equipment Recommendations       Recommendations for Other Services       Precautions / Restrictions Precautions Precautions: Fall;Knee Precaution Comments: pt able to perform active SLR so D/C KI and instructed pt Restrictions Weight Bearing Restrictions: No Other Position/Activity Restrictions: WBAT    Mobility  Bed Mobility Overal bed mobility: Needs Assistance Bed Mobility: Sit to Supine     Supine to sit: Min assist     General bed mobility comments: assisted back to bed   Transfers Overall transfer level: Needs assistance Equipment used: Rolling walker (2 wheeled)   Sit to Stand: Min guard;Min assist         General transfer comment: cues for safety and increased time.  Ambulation/Gait Ambulation/Gait assistance: Min guard;Min assist Ambulation Distance (Feet): 48 Feet Assistive device: Rolling walker (2 wheeled) Gait Pattern/deviations: Step-to pattern Gait velocity: decreased   General Gait Details: 50% VC's on proper sequencing and increased time.plus VC's for proper walker to self distance.   Stairs            Wheelchair Mobility    Modified Rankin (Stroke Patients Only)       Balance                                    Cognition                            Exercises      General Comments        Pertinent Vitals/Pain C/o 7/10 meds requested ICE applied    Home Living                      Prior Function            PT Goals (current goals can now be found in the care plan section) Progress towards PT goals: Progressing  toward goals    Frequency  7X/week    PT Plan      Co-evaluation             End of Session Equipment Utilized During Treatment: Gait belt Activity Tolerance: Patient tolerated treatment well Patient left: in bed;with call bell/phone within reach     Time: 1112-1140 PT Time Calculation (min): 28 min  Charges:  $Gait Training: 8-22 mins $Therapeutic Activity: 8-22 mins                    G Codes:      Felecia ShellingLori Shatyra Becka  PTA [facility]  Acute  Rehab Pager      [phone]

## 2014-03-21 NOTE — Progress Notes (Signed)
Pt d[facility].

## 2014-03-25 NOTE — Care Management Note (Signed)
    Page 1 of 1   03/25/2014     9:58:03 AM CARE MANAGEMENT NOTE 03/25/2014  Patient:  Henry RusselRAMEY,Kali M   Account Number:  [id number]  Date Initiated:  03/25/2014  Documentation initiated by:  Lorenda IshiharaPEELE,Candra Wegner  Subjective/Objective Assessment:     Action/Plan:   Anticipated DC Date:  03/21/2014   Anticipated DC Plan:  SKILLED NURSING FACILITY  In-house referral  Clinical Social Worker         Choice offered to / List presented to:             Status of service:  Completed, signed off Medicare Important Message given?   (If response is "NO", the following Medicare IM given date fields will be blank) Date Medicare IM given:   Date Additional Medicare IM given:    Discharge Disposition:  SKILLED NURSING FACILITY  Per UR Regulation:  Reviewed for med. necessity/level of care/duration of stay  If discussed at Long Length of Stay Meetings, dates discussed:    Comments:

## 2014-08-08 ENCOUNTER — Other Ambulatory Visit: Payer: Self-pay | Admitting: Family Medicine

## 2014-08-08 DIAGNOSIS — R634 Abnormal weight loss: Secondary | ICD-10-CM

## 2014-08-08 DIAGNOSIS — R1084 Generalized abdominal pain: Secondary | ICD-10-CM

## 2014-08-08 DIAGNOSIS — R630 Anorexia: Secondary | ICD-10-CM

## 2014-08-08 DIAGNOSIS — R195 Other fecal abnormalities: Secondary | ICD-10-CM

## 2014-08-09 ENCOUNTER — Ambulatory Visit
Admission: RE | Admit: 2014-08-09 | Discharge: 2014-08-09 | Disposition: A | Discharge status: Home / Self Care | Payer: Medicare Other | Source: Ambulatory Visit | Attending: Family Medicine | Admitting: Family Medicine

## 2014-08-09 DIAGNOSIS — R634 Abnormal weight loss: Secondary | ICD-10-CM

## 2014-08-09 DIAGNOSIS — R630 Anorexia: Secondary | ICD-10-CM

## 2014-08-09 DIAGNOSIS — R195 Other fecal abnormalities: Secondary | ICD-10-CM

## 2014-08-09 DIAGNOSIS — R1084 Generalized abdominal pain: Secondary | ICD-10-CM

## 2014-08-09 MED ORDER — IOHEXOL 300 MG/ML  SOLN
30.0000 mL | Freq: Once | INTRAMUSCULAR | Status: AC | PRN
  Administered 2014-08-09: 30 mL via ORAL

## 2014-08-09 MED ORDER — IOHEXOL 300 MG/ML  SOLN
100.0000 mL | Freq: Once | INTRAMUSCULAR | Status: AC | PRN
  Administered 2014-08-09: 100 mL via INTRAVENOUS

## 2014-10-27 IMAGING — CT CT ABD-PELV W/ CM
3 of 5 series · 12 of 36 positions shown, 18 images · IV contrast (READICAT/WATER & [ID] OMNI 300)
Comparison: 07/23/2010

CLINICAL DATA: Loss of appetite and weight loss.

EXAM:
CT ABDOMEN AND PELVIS WITH CONTRAST
TECHNIQUE: Multidetector CT imaging of the abdomen and pelvis was performed
using the standard protocol following bolus administration of
intravenous contrast.
CONTRAST:  30mL OMNIPAQUE IOHEXOL 300 MG/ML SOLN, 100mL OMNIPAQUE
IOHEXOL 300 MG/ML SOLN

[Series 3: abd/pelvis with · axial · 0.70mm/px · z∈[-252,+43]mm · 8 of 77 slices shown, 13 images]
[im 9/77  soft-tissue]
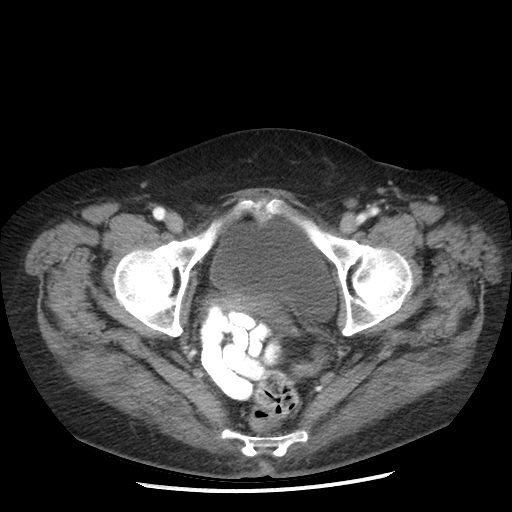
[im 9/77  bone]
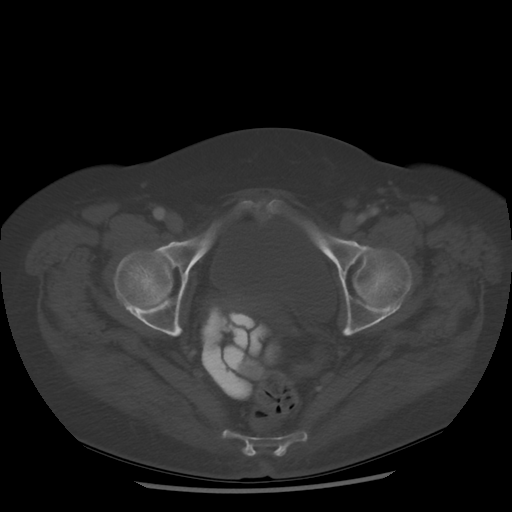
[im 17/77  soft-tissue]
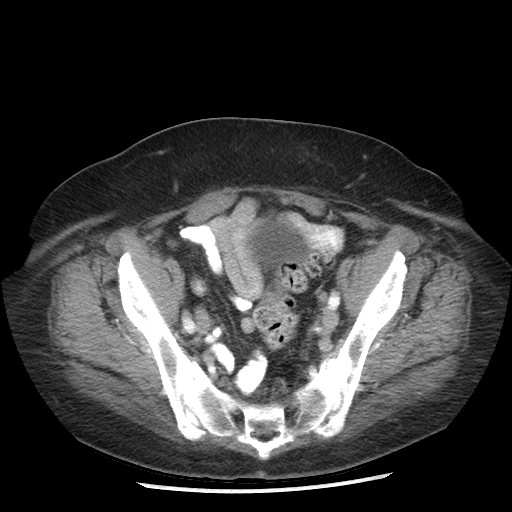
[im 26/77  soft-tissue]
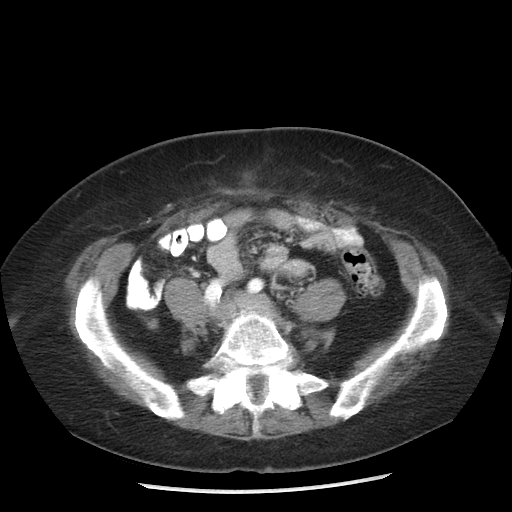
[im 34/77  soft-tissue]
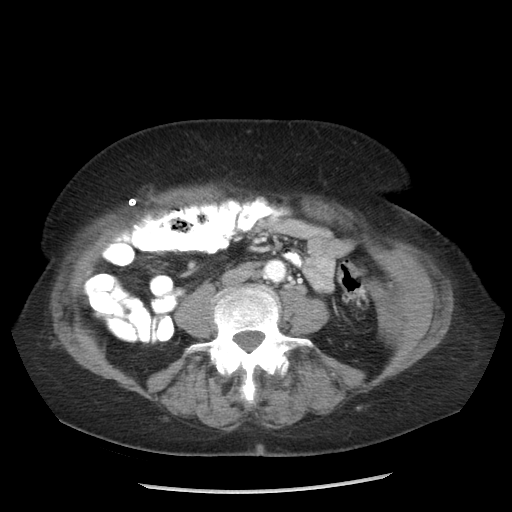
[im 43/77  soft-tissue]
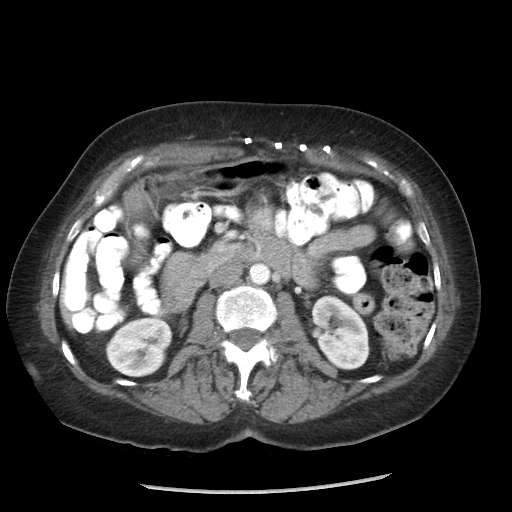
[im 43/77  lung]
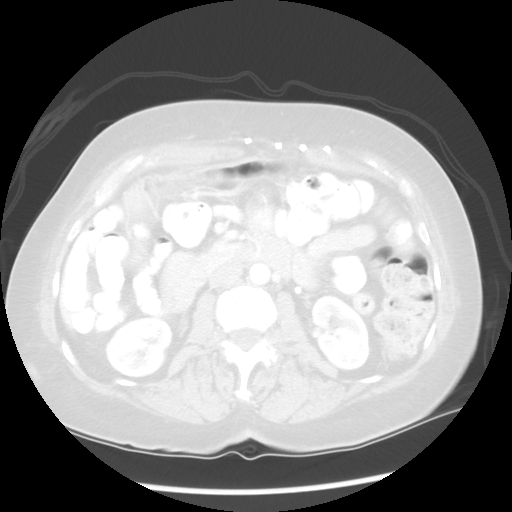
[im 51/77  soft-tissue]
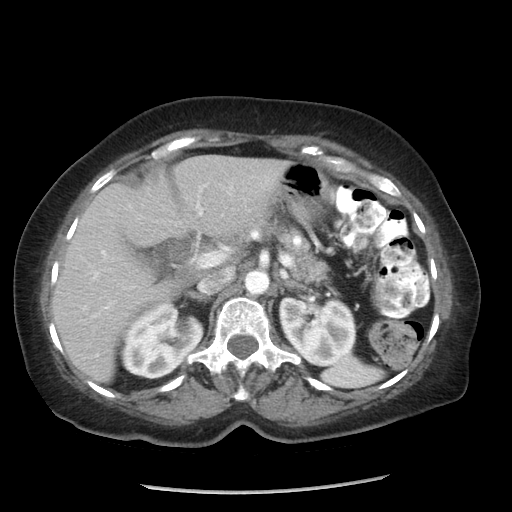
[im 51/77  lung]
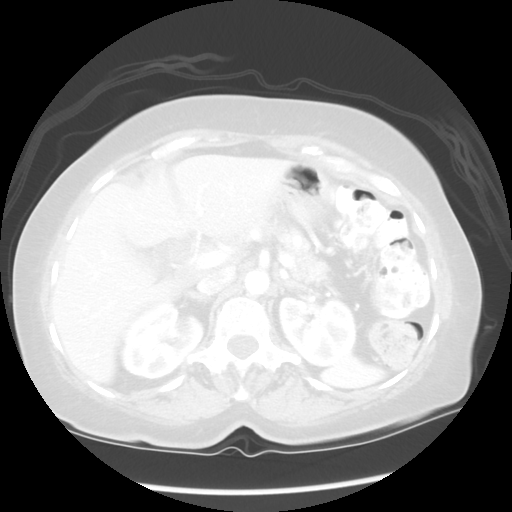
[im 60/77  soft-tissue]
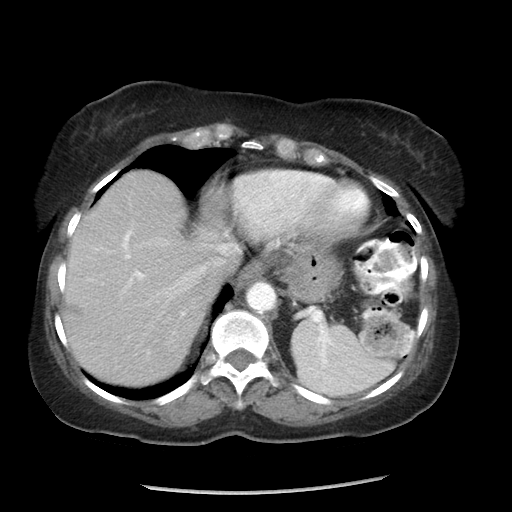
[im 60/77  lung]
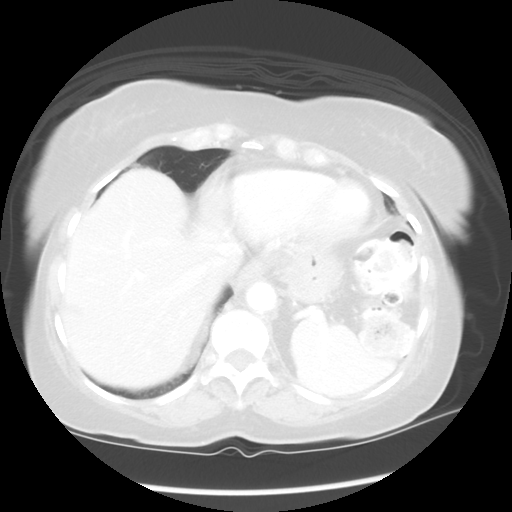
[im 68/77  soft-tissue]
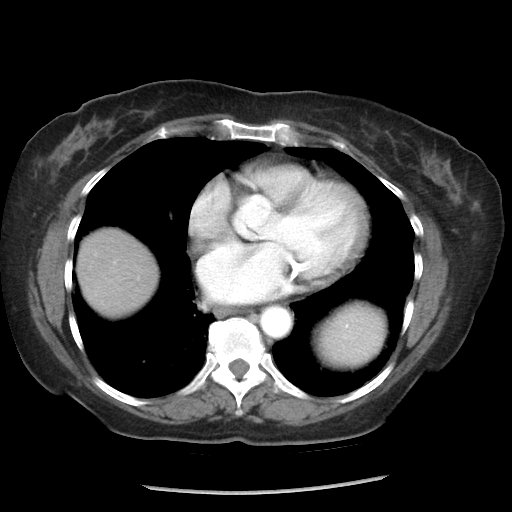
[im 68/77  lung]
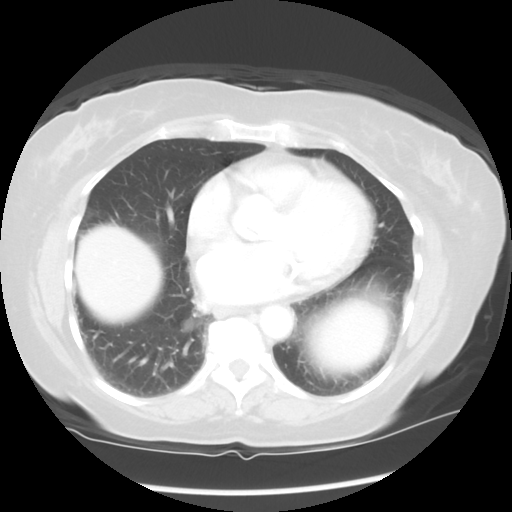

[Series 601: coronal body · coronal · 0.84mm/px · 1 of 118 slices shown, 2 images]
[im 40/118  soft-tissue]
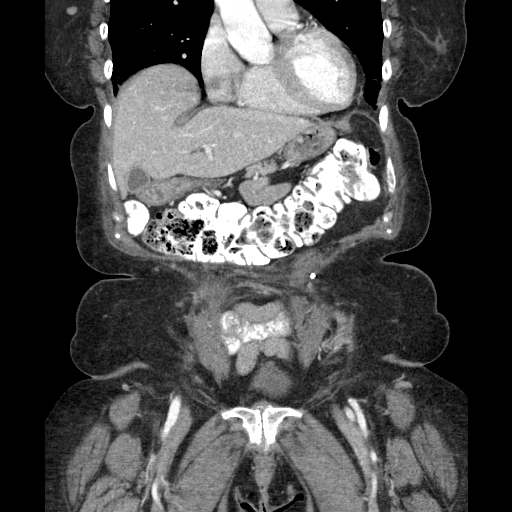
[im 40/118  bone]
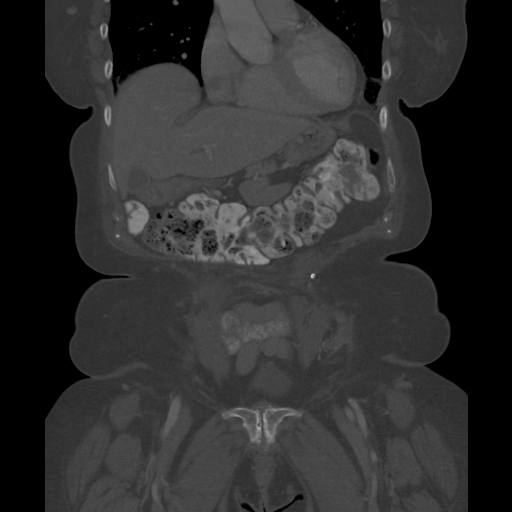

[Series 602: sagittal body · sagittal · 0.84mm/px · 3 of 145 slices shown]
[im 10/145  soft-tissue]
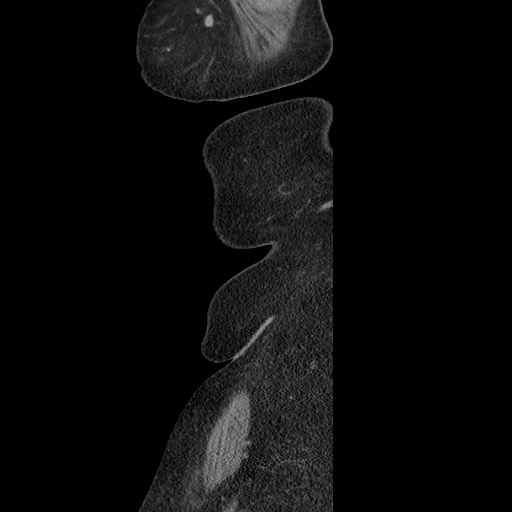
[im 28/145  soft-tissue]
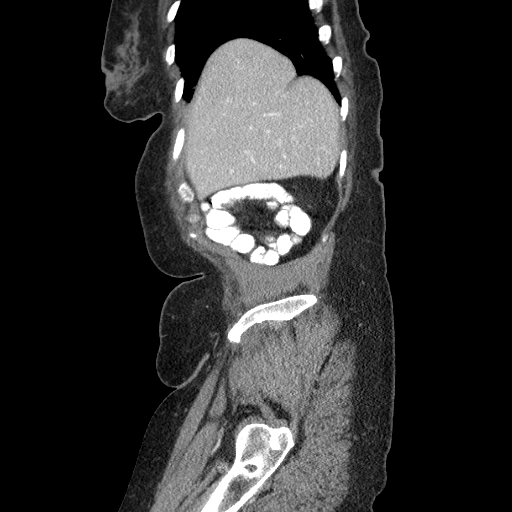
[im 46/145  soft-tissue]
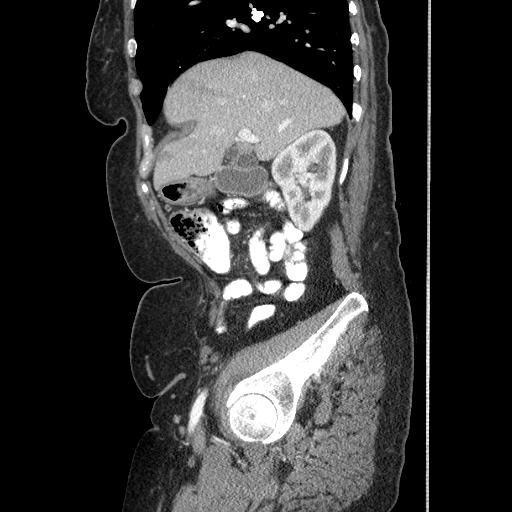

[12 of 36 positions shown; findings below may reference images not displayed]

FINDINGS: Lower chest: Calcified granuloma identified in the right base. The
lung bases appear clear. No pleural or pericardial effusion
identified.

Hepatobiliary: No focal liver abnormality identified. The
gallbladder appears normal. No biliary dilatation.

Spleen: The spleen appears normal.

Pancreas: Unremarkable appearance of the pancreas.

Stomach/Bowel: The stomach and the small bowel loops have a normal
course and caliber. No obstruction. The patient is status post right
hemicolectomy. Multiple distal colonic diverticula are identified.
No acute inflammation.

Adrenals/urinary tract: The adrenal glands are both within normal
limits. Cyst within the again noted are several right renal cysts.
Normal appearance of the left kidney. The urinary bladder appears
normal.

Vascular/Lymphatic: Calcified atherosclerotic disease involves the
abdominal aorta. There is no aneurysm. There is no retroperitoneal
adenopathy. No small bowel mesenteric adenopathy. There is a right
common iliac lymph node which measures 9 mm. This is improved from
13 mm previously.

Reproductive: Previous hysterectomy.  No adnexal mass noted.

Musculoskeletal: Review of the visualized osseous structures shows
degenerative changes at the pubic symphysis. There is a
anterolisthesis of L4 on L5. Within the lower thoracic spine and
lower lumbar spine there is evidence of disc space narrowing and
ventral spurring compatible with degenerative disc disease. Facet
hypertrophy and degenerative changes also noted.

Other: Previous ventral abdominal wall herniorrhaphy. No evidence of
ventral abdominal wall hernia recurrence.
IMPRESSION: 1. No acute findings within the abdomen or pelvis. No findings to
explain loss of appetite and weight loss.
2. Previous right hemicolectomy. Multiple colonic diverticula noted
without acute inflammation
3. Postoperative changes from ventral abdominal wall herniorrhaphy
4. Lumbar spondylosis.
5. Atherosclerosis.

## 2019-06-04 ENCOUNTER — Telehealth: Payer: Self-pay

## 2019-06-04 NOTE — Telephone Encounter (Signed)
NOTES ON FILE FROM  [facility] [phone], SENT REFERRAL TO SCHEDULING

## 2019-10-02 NOTE — Progress Notes (Signed)
Cardiology Office Note:    Date:  10/03/2019   ID:  [name], DOB April 25, 1946, MRN [id number]  PCP:  Cathie Olden, MD  Cardiologist:  No primary care provider on file.   Referring MD: Yong Channel*   Chief Complaint  Patient presents with  . Advice Only    Diastolic dysfunction/  . Carotid    History of Present Illness:    [name] is a 73 y.o. female with a hx of abnormal echocardiogram and an carotid Doppler study referred by Dr. Minette Brine Carlis Abbott.  The patient's only complaint is that of longstanding lightheaded/dizzy feeling that has a vertiginous quality.  This happens when she arises suddenly, changes directions, or tries to lie down or sit up quickly.  It goes away within a short timeframe.  She has never fallen or hurt herself.  She had work-up related to these complaints which included a 2D Doppler echocardiogram and carotid Doppler study.  Echocardiogram demonstrated diastolic dysfunction, mild LVH, left atrial enlargement, mild mitral regurgitation, and mitral annular calcification.  There was no evidence of significant pulmonary hypertension or aortic valve disease.  The Doppler study summarized below demonstrated less than 60% obstruction of the right carotid and less than 40% obstruction in the left carotid.  Past Medical History:  Diagnosis Date  . Abnormal echocardiogram   . Allergic rhinitis   . Anxiety   . Arthritis   . Bilateral pes planus   . Colon polyps   . Constipation   . DDD (degenerative disc disease), cervical   . DDD (degenerative disc disease), lumbar   . DDD (degenerative disc disease), thoracic   . Diverticulosis   . Dizziness   . Facet joint disease of cervical region   . GERD (gastroesophageal reflux disease)   . H/O hiatal hernia   . Headache(784.0)   . Hypertension   . Hypokalemia 03/19/2014  . IBS (irritable bowel syndrome)   . Neck pain, chronic   . OA (osteoarthritis) of knee 03/18/2014  .  Osteopenia   . PONV (postoperative nausea and vomiting)   . Postoperative anemia due to acute blood loss 03/19/2014  . Sciatica of right side   . Sinusitis, chronic   . Vagal reaction     Past Surgical History:  Procedure Laterality Date  . ABDOMINAL HYSTERECTOMY    . COLON SURGERY    . EYE SURGERY     right retina detachment  . HEMICOLECTOMY Right   . HERNIA REPAIR     umbilical  . INGUINAL HERNIA REPAIR     left  . JOINT REPLACEMENT     right knee  . LIPOMA EXCISION     back  . TOTAL KNEE ARTHROPLASTY Left 03/18/2014   Procedure: LEFT TOTAL KNEE ARTHROPLASTY;  Surgeon: Gearlean Alf, MD;  Location: [facility];  Service: Orthopedics;  Laterality: Left;    Current Medications: Current Meds  Medication Sig  . ALPRAZolam (XANAX) 0.5 MG tablet Take 1 tablet (0.5 mg total) by mouth 2 (two) times daily as needed for anxiety.  Marland Kitchen amLODIPine-Valsartan-HCTZ 10-320-25 MG TABS Take by mouth daily.  Marland Kitchen aspirin EC 81 MG tablet Take 81 mg by mouth daily.  . celecoxib (CELEBREX) 200 MG capsule Take 200 mg by mouth daily.  . citalopram (CELEXA) 20 MG tablet Take 20 mg by mouth daily.  . fluticasone (FLONASE) 50 MCG/ACT nasal spray Place 1 spray into both nostrils 2 (two) times daily.  Marland Kitchen gabapentin (NEURONTIN) 300 MG capsule Take 300 mg  by mouth 2 (two) times daily.  Marland Kitchen. loratadine (CLARITIN) 10 MG tablet Take 10 mg by mouth daily.  Marland Kitchen. omeprazole (PRILOSEC) 40 MG capsule Take 40 mg by mouth daily.  Marland Kitchen. VITAMIN D, CHOLECALCIFEROL, PO Take 1,000 Units by mouth.     Allergies:   Baclofen, Sudafed [pseudoephedrine], and Sulfur   Social History   Socioeconomic History  . Marital status: Divorced    Spouse name: Not on file  . Number of children: Not on file  . Years of education: Not on file  . Highest education level: Not on file  Occupational History  . Not on file  Social Needs  . Financial resource strain: Not on file  . Food insecurity    Worry: Not on file    Inability: Not on file  .  Transportation needs    Medical: Not on file    Non-medical: Not on file  Tobacco Use  . Smoking status: Former Smoker    Quit date: 01/25/1984    Years since quitting: 35.7  . Smokeless tobacco: Never Used  Substance and Sexual Activity  . Alcohol use: Yes    Comment: socially  . Drug use: No  . Sexual activity: Not on file  Lifestyle  . Physical activity    Days per week: Not on file    Minutes per session: Not on file  . Stress: Not on file  Relationships  . Social Musicianconnections    Talks on phone: Not on file    Gets together: Not on file    Attends religious service: Not on file    Active member of club or organization: Not on file    Attends meetings of clubs or organizations: Not on file    Relationship status: Not on file  Other Topics Concern  . Not on file  Social History Narrative  . Not on file     Family History: The patient's family history is not on file.  ROS:   Please see the history of present illness.    She has discomfort in her neck.  She has chronic soreness/pain in the left shoulder.  All other systems reviewed and are negative.  EKGs/Labs/Other Studies Reviewed:    The following studies were reviewed today: [facility]:  EF 60 to 65% with moderately abnormal diastolic filling and concentric hypertrophy.  Moderately dilated left atrium  Mild mitral regurgitation with moderate mitral annular calcification  Carotid Doppler study: June 2020  40 to 59% right internal carotid plaque  0 to 39% left internal carotid plaque  EKG:  EKG normal sinus rhythm, left anterior hemiblock, poor R wave progression V1 through V4.  Nonspecific ST-T wave abnormality.  Recent Labs: No results found for requested labs within last 8760 hours.  Recent Lipid Panel No results found for: CHOL, TRIG, HDL, CHOLHDL, VLDL, LDLCALC, LDLDIRECT  Physical Exam:    VS:  BP 112/66   Pulse 89   Ht 4\' 11"  (1.499 m)   Wt 169  lb 12.8 oz (77 kg)   SpO2 96%   BMI 34.30 kg/m     Wt Readings from Last 3 Encounters:  10/03/19 169 lb 12.8 oz (77 kg)  03/19/14 162 lb (73.5 kg)  03/12/14 162 lb 9.6 oz (73.8 kg)     GEN: Short stature.  Appears younger than stated age.. No acute distress HEENT: Normal NECK: No JVD. LYMPHATICS: No lymphadenopathy CARDIAC:  RRR without murmur, gallop, or edema. VASCULAR:  Normal Pulses. No bruits. RESPIRATORY:  Clear to auscultation without rales, wheezing or rhonchi  ABDOMEN: Soft, non-tender, non-distended, No pulsatile mass, MUSCULOSKELETAL: No deformity  SKIN: Warm and dry NEUROLOGIC:  Alert and oriented x 3 PSYCHIATRIC:  Normal affect   ASSESSMENT:    1. Essential hypertension   2. Abnormal echocardiogram   3. Diastolic dysfunction   4. Bilateral carotid artery stenosis   5. Educated about COVID-19 virus infection    PLAN:    In order of problems listed above:  1. The blood pressure based upon today's evaluation is excellently controlled on the current medical regimen.  Target at her age is 130/80 mmHg. 2. A multitude of age-related findings are noted on the echocardiogram.  Diastolic dysfunction, mitral annular calcification, mild mitral regurgitation and mild left ventricular hypertrophy are not surprising given her age and history of hypertension.  In the absence of symptoms, no change in symptoms or further therapy is needed. 3. The diastolic dysfunction is typical at her age and she has no evidence of volume overload to suggest diastolic heart failure. 4. Needs aggressive secondary prevention to control progression of carotid disease.  Please see below. 5. The W's 3 are being used in her lifestyle to avoid COVID-19 infection.  Overall education and awareness concerning primary/secondary risk prevention was discussed in detail: LDL less than 70, hemoglobin A1c less than 7, blood pressure target less than 130/80 mmHg, >150 minutes of moderate aerobic activity per  week, avoidance of smoking, weight control (via diet and exercise), and continued surveillance/management of/for obstructive sleep apnea.    Medication Adjustments/Labs and Tests Ordered: Current medicines are reviewed at length with the patient today.  Concerns regarding medicines are outlined above.  Orders Placed This Encounter  Procedures  . EKG 12-Lead   No orders of the defined types were placed in this encounter.   Patient Instructions  Medication Instructions:  Your physician recommends that you continue on your current medications as directed. Please refer to the Current Medication list given to you today.  *If you need a refill on your cardiac medications before your next appointment, please call your pharmacy*  Lab Work: None If you have labs (blood work) drawn today and your tests are completely normal, you will receive your results only by: Marland Kitchen MyChart Message (if you have MyChart) OR . A paper copy in the mail If you have any lab test that is abnormal or we need to change your treatment, we will call you to review the results.  Testing/Procedures: None  Follow-Up: At [facility], you and your health needs are our priority.  As part of our continuing mission to provide you with exceptional heart care, we have created designated Provider Care Teams.  These Care Teams include your primary Cardiologist (physician) and Advanced Practice Providers (APPs -  Physician Assistants and Nurse Practitioners) who all work together to provide you with the care you need, when you need it.  Your next appointment:   As needed   The format for your next appointment:   In Person  Provider:   You may see Dr. Verdis Prime or one of the following Advanced Practice Providers on your designated Care Team:    Norma Fredrickson, NP  Nada Boozer, NP  Georgie Chard, NP   Other Instructions      Signed, Lesleigh Noe, MD  10/03/2019 5:45 PM    [facility]

## 2019-10-03 ENCOUNTER — Ambulatory Visit (INDEPENDENT_AMBULATORY_CARE_PROVIDER_SITE_OTHER): Payer: Medicare Other | Admitting: Interventional Cardiology

## 2019-10-03 ENCOUNTER — Encounter (INDEPENDENT_AMBULATORY_CARE_PROVIDER_SITE_OTHER): Payer: Self-pay

## 2019-10-03 ENCOUNTER — Encounter: Payer: Self-pay | Admitting: Interventional Cardiology

## 2019-10-03 ENCOUNTER — Other Ambulatory Visit: Payer: Self-pay

## 2019-10-03 VITALS — BP 112/66 | HR 89 | Ht 59.0 in | Wt 169.8 lb

## 2019-10-03 DIAGNOSIS — I10 Essential (primary) hypertension: Secondary | ICD-10-CM

## 2019-10-03 DIAGNOSIS — I6523 Occlusion and stenosis of bilateral carotid arteries: Secondary | ICD-10-CM

## 2019-10-03 DIAGNOSIS — I5189 Other ill-defined heart diseases: Secondary | ICD-10-CM | POA: Diagnosis not present

## 2019-10-03 DIAGNOSIS — Z7189 Other specified counseling: Secondary | ICD-10-CM

## 2019-10-03 DIAGNOSIS — R931 Abnormal findings on diagnostic imaging of heart and coronary circulation: Secondary | ICD-10-CM

## 2019-10-03 NOTE — Patient Instructions (Signed)
Medication Instructions:  Your physician recommends that you continue on your current medications as directed. Please refer to the Current Medication list given to you today.  *If you need a refill on your cardiac medications before your next appointment, please call your pharmacy*  Lab Work: None If you have labs (blood work) drawn today and your tests are completely normal, you will receive your results only by: . MyChart Message (if you have MyChart) OR . A paper copy in the mail If you have any lab test that is abnormal or we need to change your treatment, we will call you to review the results.  Testing/Procedures: None  Follow-Up: At [facility], you and your health needs are our priority.  As part of our continuing mission to provide you with exceptional heart care, we have created designated Provider Care Teams.  These Care Teams include your primary Cardiologist (physician) and Advanced Practice Providers (APPs -  Physician Assistants and Nurse Practitioners) who all work together to provide you with the care you need, when you need it.  Your next appointment:   As needed   The format for your next appointment:   In Person  Provider:   You may see Dr. Henry Smith or one of the following Advanced Practice Providers on your designated Care Team:    Lori Gerhardt, NP  Laura Ingold, NP  Jill McDaniel, NP   Other Instructions   

## 2020-01-22 ENCOUNTER — Ambulatory Visit: Payer: Medicare Other | Attending: Internal Medicine

## 2020-01-22 DIAGNOSIS — Z23 Encounter for immunization: Secondary | ICD-10-CM | POA: Insufficient documentation

## 2020-01-22 NOTE — Progress Notes (Signed)
   Covid-19 Vaccination Clinic  Name:  [name]    MRN: [id number] DOB: 08-18-1946  01/22/2020  Ms. Sole was observed post Covid-19 immunization for 15 minutes without incident. She was provided with Vaccine Information Sheet and instruction to access the V-Safe system.   [name] was instructed to call 911 with any severe reactions post vaccine: Marland Kitchen Difficulty breathing  . Swelling of face and throat  . A fast heartbeat  . A bad rash all over body  . Dizziness and weakness   Immunizations Administered    Name Date Dose VIS Date Route   Pfizer COVID-19 Vaccine 01/22/2020  3:12 PM 0.3 mL 10/26/2019 Intramuscular   Manufacturer: [employer], [employer]   Lot: [id number]   NDC: [address]-3

## 2020-01-23 ENCOUNTER — Ambulatory Visit: Payer: Medicare Other

## 2022-09-10 ENCOUNTER — Other Ambulatory Visit: Payer: Self-pay | Admitting: Gastroenterology

## 2022-09-10 DIAGNOSIS — R1033 Periumbilical pain: Secondary | ICD-10-CM

## 2022-10-02 ENCOUNTER — Ambulatory Visit (HOSPITAL_BASED_OUTPATIENT_CLINIC_OR_DEPARTMENT_OTHER)
Admission: RE | Admit: 2022-10-02 | Discharge: 2022-10-02 | Disposition: A | Discharge status: Home / Self Care | Payer: Medicare Other | Source: Ambulatory Visit | Attending: Gastroenterology | Admitting: Gastroenterology

## 2022-10-02 DIAGNOSIS — R1033 Periumbilical pain: Secondary | ICD-10-CM | POA: Diagnosis not present

## 2022-10-02 MED ORDER — IOHEXOL 300 MG/ML  SOLN
100.0000 mL | Freq: Once | INTRAMUSCULAR | Status: AC | PRN
  Administered 2022-10-02: 100 mL via INTRAVENOUS

## 2024-03-14 ENCOUNTER — Encounter (INDEPENDENT_AMBULATORY_CARE_PROVIDER_SITE_OTHER): Payer: Self-pay | Admitting: Otolaryngology

## 2024-03-22 ENCOUNTER — Encounter (INDEPENDENT_AMBULATORY_CARE_PROVIDER_SITE_OTHER): Payer: Self-pay | Admitting: Otolaryngology

## 2024-05-25 ENCOUNTER — Ambulatory Visit (INDEPENDENT_AMBULATORY_CARE_PROVIDER_SITE_OTHER): Admitting: Otolaryngology

## 2024-05-25 ENCOUNTER — Encounter (INDEPENDENT_AMBULATORY_CARE_PROVIDER_SITE_OTHER): Payer: Self-pay | Admitting: Otolaryngology

## 2024-05-25 VITALS — BP 107/63 | HR 60

## 2024-05-25 DIAGNOSIS — R053 Chronic cough: Secondary | ICD-10-CM | POA: Diagnosis not present

## 2024-05-25 DIAGNOSIS — R0982 Postnasal drip: Secondary | ICD-10-CM

## 2024-05-25 DIAGNOSIS — R0981 Nasal congestion: Secondary | ICD-10-CM

## 2024-05-25 DIAGNOSIS — K219 Gastro-esophageal reflux disease without esophagitis: Secondary | ICD-10-CM

## 2024-05-25 NOTE — Progress Notes (Signed)
 ENT CONSULT:  Reason for Consult: chronic  cough x 3 months   HPI: Discussed the use of AI scribe software for clinical note transcription with the patient, who gave verbal consent to proceed.  History of Present Illness [name] is an 78 year old female who presents with a chronic dry cough. She was referred by her primary care doctor for evaluation of a chronic cough and abnormal chest x-ray findings.  She has been experiencing a dry cough for the past three of months, which led to a chest x-ray revealing a mass per patient report (we do not have access to review). She has no history of smoking or lung disease, although she experimented with smoking in college without inhaling. She is unsure of any specific triggers for her cough but notes a sensation of tingling in her throat, especially when eating.  She has a history of acid reflux for which she takes omeprazole  daily. She also has been diagnosed with sinusitis and severe allergies. For her allergies, she takes Allegra and uses Flonase  nasal spray daily. She uses a Navage device for nasal irrigation.  During the review of symptoms, she reports no coughing up of phlegm. She experiences soreness in her throat but does not feel the urge to cough when pressure is applied to her throat.  Records Reviewed:  Telephone encounter [facility]  05/09/2024 4:34 PM - Provider called and talked to patient. She reports taking all of the Azithromycin that was sent in, her last dose was on 05/08/2024. She continues to have a cough and tickle in the back of her throat and now having some pain on the left side of her throat. She reports taking Omeprazole  40 mg once a day and Allegra. She also took the lasix that was sent in based on her XR chest results. She has been referred to [facility], her appointment is scheduled for 05/25/2024. Given her continued complaints of cough and sore throat, we are sending in Amoxicillin-Clavulanate to [facility].    PCP OV NP [name] 05/02/24 The patient is an 78 year old female who presents to the clinic with continued complaints of dry cough at night, during the day, tickle in throat, and clear mucous for the past few weeks that is just irritating. She reports taking omeprazole  as prescribed; claritin , and flonase  nasal spray. She was seen for this same complaint on 03/05/2024, she was referred to ENT, her appt is scheduled for 05/25/2024. She has also been doing the nasal lavages as suggested with little relief. She also reports runny nose and sneezing. She denies any fevers. She took a home COVID test on Monday that was negative.    Past Medical History:  Diagnosis Date   Abnormal echocardiogram    Allergic rhinitis    Anxiety    Arthritis    Bilateral pes planus    Colon polyps    Constipation    DDD (degenerative disc disease), cervical    DDD (degenerative disc disease), lumbar    DDD (degenerative disc disease), thoracic    Diverticulosis    Dizziness    Facet joint disease of cervical region    GERD (gastroesophageal reflux disease)    H/O hiatal hernia    Headache(784.0)    Hypertension    Hypokalemia 03/19/2014   IBS (irritable bowel syndrome)    Neck pain, chronic    OA (osteoarthritis) of knee 03/18/2014   Osteopenia    PONV (postoperative nausea and vomiting)  Postoperative anemia due to acute blood loss 03/19/2014   Sciatica of right side    Sinusitis, chronic    Vagal reaction     Past Surgical History:  Procedure Laterality Date   ABDOMINAL HYSTERECTOMY     COLON SURGERY     EYE SURGERY     right retina detachment   HEMICOLECTOMY Right    HERNIA REPAIR     umbilical   INGUINAL HERNIA REPAIR     left   JOINT REPLACEMENT     right knee   LIPOMA EXCISION     back   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 No  pertinent family history.  Social History:  reports that she quit smoking about 40 years ago. She has never used smokeless tobacco. She reports current alcohol use. She reports that she does not use drugs.  Allergies:  Allergies  Allergen Reactions   Baclofen Nausea Only   Elemental Sulfur    Sudafed [Pseudoephedrine] Swelling    Medications: I have reviewed the patient's current medications.  The PMH, PSH, Medications, Allergies, and SH were reviewed and updated.  ROS: Constitutional: Negative for fever, weight loss and weight gain. Cardiovascular: Negative for chest pain and dyspnea on exertion. Respiratory: Is not experiencing shortness of breath at rest. Gastrointestinal: Negative for nausea and vomiting. Neurological: Negative for headaches. Psychiatric: The patient is not nervous/anxious  Blood pressure 107/63, pulse 60, SpO2 98%. There is no height or weight on file to calculate BMI.  PHYSICAL EXAM:  Exam: General: Well-developed, well-nourished Respiratory Respiratory effort: Equal inspiration and expiration without stridor Cardiovascular Peripheral Vascular: Warm extremities with equal color/perfusion Eyes: No nystagmus with equal extraocular motion bilaterally Neuro/Psych/Balance: Patient oriented to person, place, and time; Appropriate mood and affect; Gait is intact with no imbalance; Cranial nerves I-XII are intact Head and Face Inspection: Normocephalic and atraumatic without mass or lesion Palpation: Facial skeleton intact without bony stepoffs Salivary Glands: No mass or tenderness Facial Strength: Facial motility symmetric and full bilaterally ENT Pinna: External ear intact and fully developed External canal: Canal is patent with intact skin Tympanic Membrane: Clear and mobile External Nose: No scar or anatomic deformity Internal Nose: Septum is deviated to the left. No polyp, or purulence. Mucosal edema and erythema present.  Bilateral inferior  turbinate hypertrophy.  Lips, Teeth, and gums: Mucosa and teeth intact and viable TMJ: No pain to palpation with full mobility Oral cavity/oropharynx: No erythema or exudate, no lesions present Nasopharynx: No mass or lesion with intact mucosa Hypopharynx: Intact mucosa without pooling of secretions Larynx Glottic: Full true vocal cord mobility without lesion or mass Supraglottic: Normal appearing epiglottis and AE folds Interarytenoid Space: Moderate pachydermia&edema Subglottic Space: Patent without lesion or edema Neck Neck and Trachea: Midline trachea without mass or lesion Thyroid: No mass or nodularity Lymphatics: No lymphadenopathy  Procedure: Preoperative diagnosis: chronic cough   Postoperative diagnosis:   Same + GERD LPR  Procedure: Flexible fiberoptic laryngoscopy  Surgeon: Jaiven Graveline, MD  Anesthesia: Topical lidocaine and Afrin Complications: None Condition is stable throughout exam  Indications and consent:  The patient presents to the clinic with above symptoms. Indirect laryngoscopy view was incomplete. Thus it was recommended that they undergo a flexible fiberoptic laryngoscopy. All of the risks, benefits, and potential complications were reviewed with the patient preoperatively and verbal informed consent was obtained.  Procedure: The patient was seated upright in the clinic. Topical lidocaine and Afrin were applied to the nasal cavity. After adequate anesthesia had occurred, I then proceeded to pass the flexible telescope into the nasal cavity. The nasal cavity was patent without rhinorrhea or polyp. The nasopharynx was also patent without mass or lesion. The base of tongue was visualized and was normal. There were no signs of pooling of secretions in the piriform sinuses. The true vocal folds were mobile bilaterally. There were no signs of glottic or supraglottic mucosal lesion or mass. There was moderate interarytenoid pachydermia and post cricoid edema. The  telescope was then slowly withdrawn and the patient tolerated the procedure throughout.      Studies Reviewed: Reports CXR done at outside facility - not available for review - patient states she had a mass on CXR  Assessment/Plan: No diagnosis found.  Assessment and Plan Assessment & Plan Chronic cough Chronic cough likely due to reflux irritation and allergic rhinitis vs pulmonary source. Abnormal chest x-ray requires further evaluation. Will order CT chest.  I discussed common etiologies of chronic cough including GERD/allergies and PND and lung disease with the patient and explained that in most cases the cause of chronic cough is multi-factorial - Order CT scan of the chest to rule out lung pathology. - Consider referral to pulmonology based on CT scan results. - Continue Allegra and Flonase  for allergy management. -  continue using Navage for postnasal drainage.  GERD LPR - continue Omeprazole  -  Reflux Gourmet after meals - diet and lifestyle changes to minimize GERD - Refer to [employer] blog for dietary and lifestyle modifications/reflux cook book   Thank you for allowing me to participate in the care of this patient. Please do not hesitate to contact me with any questions or concerns.   Elena Larry, MD Otolaryngology [facility] Phone: [phone] Fax: [phone]    05/25/2024, 10:08 AM

## 2024-05-25 NOTE — Patient Instructions (Signed)

## 2024-06-12 ENCOUNTER — Ambulatory Visit (HOSPITAL_BASED_OUTPATIENT_CLINIC_OR_DEPARTMENT_OTHER)
Admission: RE | Admit: 2024-06-12 | Discharge: 2024-06-12 | Disposition: A | Discharge status: Home / Self Care | Source: Ambulatory Visit | Attending: Otolaryngology | Admitting: Otolaryngology

## 2024-06-12 DIAGNOSIS — R053 Chronic cough: Secondary | ICD-10-CM | POA: Insufficient documentation

## 2024-06-12 LAB — POCT I-STAT CREATININE: Creatinine, Ser: 1.4 mg/dL — ABNORMAL HIGH (ref 0.44–1.00)

## 2024-06-12 MED ORDER — IOHEXOL 300 MG/ML  SOLN
100.0000 mL | Freq: Once | INTRAMUSCULAR | Status: AC | PRN
  Administered 2024-06-12: 75 mL via INTRAVENOUS

## 2024-08-30 ENCOUNTER — Ambulatory Visit (INDEPENDENT_AMBULATORY_CARE_PROVIDER_SITE_OTHER): Admitting: Otolaryngology
